# Patient Record
Sex: Female | Born: 1985 | Race: White | Hispanic: No | Marital: Married | Smoking: Never smoker
Health system: Southern US, Community
[De-identification: ages and names within clinical notes are randomized; demographics above are authoritative.]

## PROBLEM LIST (undated history)

## (undated) DIAGNOSIS — D649 Anemia, unspecified: Secondary | ICD-10-CM

## (undated) DIAGNOSIS — R5383 Other fatigue: Secondary | ICD-10-CM

## (undated) DIAGNOSIS — F53 Postpartum depression: Secondary | ICD-10-CM

## (undated) DIAGNOSIS — O99345 Other mental disorders complicating the puerperium: Secondary | ICD-10-CM

## (undated) DIAGNOSIS — G971 Other reaction to spinal and lumbar puncture: Secondary | ICD-10-CM

## (undated) DIAGNOSIS — R002 Palpitations: Secondary | ICD-10-CM

## (undated) HISTORY — DX: Anemia, unspecified: D64.9

## (undated) HISTORY — DX: Other mental disorders complicating the puerperium: O99.345

## (undated) HISTORY — DX: Other reaction to spinal and lumbar puncture: G97.1

## (undated) HISTORY — DX: Postpartum depression: F53.0

## (undated) HISTORY — PX: WISDOM TOOTH EXTRACTION: SHX21

## (undated) HISTORY — DX: Palpitations: R00.2

## (undated) HISTORY — DX: Other fatigue: R53.83

---

## 2013-12-21 ENCOUNTER — Encounter: Payer: Self-pay | Admitting: Advanced Practice Midwife

## 2013-12-21 ENCOUNTER — Ambulatory Visit (INDEPENDENT_AMBULATORY_CARE_PROVIDER_SITE_OTHER): Admitting: Advanced Practice Midwife

## 2013-12-21 VITALS — BP 134/84 | Ht 63.0 in | Wt 153.0 lb

## 2013-12-21 DIAGNOSIS — Z3480 Encounter for supervision of other normal pregnancy, unspecified trimester: Secondary | ICD-10-CM

## 2013-12-21 DIAGNOSIS — O34219 Maternal care for unspecified type scar from previous cesarean delivery: Secondary | ICD-10-CM | POA: Insufficient documentation

## 2013-12-21 DIAGNOSIS — O09299 Supervision of pregnancy with other poor reproductive or obstetric history, unspecified trimester: Secondary | ICD-10-CM | POA: Insufficient documentation

## 2013-12-21 DIAGNOSIS — E039 Hypothyroidism, unspecified: Secondary | ICD-10-CM

## 2013-12-21 LAB — OB RESULTS CONSOLE GC/CHLAMYDIA
Chlamydia: NEGATIVE
Gonorrhea: NEGATIVE

## 2013-12-21 NOTE — Progress Notes (Signed)
p-109  Bedside U/S showed IUP with FHT 178bpm   CRL 21.612mm and GA [id number]

## 2013-12-22 LAB — TSH: TSH: 0.802 u[IU]/mL (ref 0.350–4.500)

## 2013-12-22 LAB — OBSTETRIC PANEL
ANTIBODY SCREEN: NEGATIVE
BASOS ABS: 0 10*3/uL (ref 0.0–0.1)
Basophils Relative: 0 % (ref 0–1)
EOS PCT: 1 % (ref 0–5)
Eosinophils Absolute: 0.1 10*3/uL (ref 0.0–0.7)
HEMATOCRIT: 38.3 % (ref 36.0–46.0)
HEMOGLOBIN: 12.9 g/dL (ref 12.0–15.0)
Hepatitis B Surface Ag: NEGATIVE
LYMPHS PCT: 21 % (ref 12–46)
Lymphs Abs: 1.6 10*3/uL (ref 0.7–4.0)
MCH: 28.9 pg (ref 26.0–34.0)
MCHC: 33.7 g/dL (ref 30.0–36.0)
MCV: 85.9 fL (ref 78.0–100.0)
MONO ABS: 0.6 10*3/uL (ref 0.1–1.0)
MONOS PCT: 8 % (ref 3–12)
Neutro Abs: 5.4 10*3/uL (ref 1.7–7.7)
Neutrophils Relative %: 70 % (ref 43–77)
Platelets: 238 10*3/uL (ref 150–400)
RBC: 4.46 MIL/uL (ref 3.87–5.11)
RDW: 13.6 % (ref 11.5–15.5)
Rh Type: POSITIVE
Rubella: 1.44 Index — ABNORMAL HIGH (ref <0.90)
WBC: 7.7 10*3/uL (ref 4.0–10.5)

## 2013-12-22 LAB — PRESCRIPTION MONITORING PROFILE (19 PANEL)
AMPHETAMINE/METH: NEGATIVE ng/mL
BENZODIAZEPINE SCREEN, URINE: NEGATIVE ng/mL
BUPRENORPHINE, URINE: NEGATIVE ng/mL
Barbiturate Screen, Urine: NEGATIVE ng/mL
CANNABINOID SCRN UR: NEGATIVE ng/mL
CARISOPRODOL, URINE: NEGATIVE ng/mL
Cocaine Metabolites: NEGATIVE ng/mL
Creatinine, Urine: 143.55 mg/dL (ref >20.0)
Fentanyl, Ur: NEGATIVE ng/mL
MDMA URINE: NEGATIVE ng/mL
METHADONE SCREEN, URINE: NEGATIVE ng/mL
METHAQUALONE SCREEN (URINE): NEGATIVE ng/mL
Meperidine, Ur: NEGATIVE ng/mL
NITRITES URINE, INITIAL: NEGATIVE ug/mL
Opiate Screen, Urine: NEGATIVE ng/mL
Oxycodone Screen, Ur: NEGATIVE ng/mL
Phencyclidine, Ur: NEGATIVE ng/mL
Propoxyphene: NEGATIVE ng/mL
TAPENTADOLUR: NEGATIVE ng/mL
Tramadol Scrn, Ur: NEGATIVE ng/mL
Zolpidem, Urine: NEGATIVE ng/mL
pH, Initial: 7.5 pH (ref 4.5–8.9)

## 2013-12-22 LAB — PROTEIN / CREATININE RATIO, URINE
Creatinine, Urine: 141.8 mg/dL
Protein Creatinine Ratio: 0.04 (ref <0.15)
Total Protein, Urine: 6 mg/dL

## 2013-12-22 LAB — COMPREHENSIVE METABOLIC PANEL
ALBUMIN: 4.1 g/dL (ref 3.5–5.2)
ALK PHOS: 57 U/L (ref 39–117)
ALT: 19 U/L (ref 0–35)
AST: 15 U/L (ref 0–37)
BUN: 7 mg/dL (ref 6–23)
CHLORIDE: 103 meq/L (ref 96–112)
CO2: 26 mEq/L (ref 19–32)
Calcium: 9.2 mg/dL (ref 8.4–10.5)
Creat: 0.45 mg/dL — ABNORMAL LOW (ref 0.50–1.10)
Glucose, Bld: 73 mg/dL (ref 70–99)
POTASSIUM: 4.2 meq/L (ref 3.5–5.3)
SODIUM: 137 meq/L (ref 135–145)
Total Bilirubin: 0.3 mg/dL (ref 0.3–1.2)
Total Protein: 6.3 g/dL (ref 6.0–8.3)

## 2013-12-22 LAB — GC/CHLAMYDIA PROBE AMP
CT Probe RNA: NEGATIVE
GC PROBE AMP APTIMA: NEGATIVE

## 2013-12-22 NOTE — Patient Instructions (Signed)
Pregnancy - First Trimester  During sexual intercourse, millions of sperm go into the vagina. Only 1 sperm will penetrate and fertilize the female egg while it is in the Fallopian tube. One week later, the fertilized egg implants into the wall of the uterus. An embryo begins to develop into a baby. At 6 to 8 weeks, the eyes and face are formed and the heartbeat can be seen on ultrasound. At the end of 12 weeks (first trimester), all the baby's organs are formed. Now that you are pregnant, you will want to do everything you can to have a healthy baby. Two of the most important things are to get good prenatal care and follow your caregiver's instructions. Prenatal care is all the medical care you receive before the baby's birth. It is given to prevent, find, and treat problems during the pregnancy and childbirth.  PRENATAL EXAMS  · During prenatal visits, your weight, blood pressure, and urine are checked. This is done to make sure you are healthy and progressing normally during the pregnancy.  · A pregnant woman should gain 25 to 35 pounds during the pregnancy. However, if you are overweight or underweight, your caregiver will advise you regarding your weight.  · Your caregiver will ask and answer questions for you.  · Blood work, cervical cultures, other necessary tests, and a Pap test are done during your prenatal exams. These tests are done to check on your health and the probable health of your baby. Tests are strongly recommended and done for HIV with your permission. This is the virus that causes AIDS. These tests are done because medicines can be given to help prevent your baby from being born with this infection should you have been infected without knowing it. Blood work is also used to find out your blood type, previous infections, and follow your blood levels (hemoglobin).  · Low hemoglobin (anemia) is common during pregnancy. Iron and vitamins are given to help prevent this. Later in the pregnancy, blood  tests for diabetes will be done along with any other tests if any problems develop.  · You may need other tests to make sure you and the baby are doing well.  CHANGES DURING THE FIRST TRIMESTER   Your body goes through many changes during pregnancy. They vary from person to person. Talk to your caregiver about changes you notice and are concerned about. Changes can include:  · Your menstrual period stops.  · The egg and sperm carry the genes that determine what you look like. Genes from you and your partner are forming a baby. The female genes determine whether the baby is a boy or a girl.  · Your body increases in girth and you may feel bloated.  · Feeling sick to your stomach (nauseous) and throwing up (vomiting). If the vomiting is uncontrollable, call your caregiver.  · Your breasts will begin to enlarge and become tender.  · Your nipples may stick out more and become darker.  · The need to urinate more. Painful urination may mean you have a bladder infection.  · Tiring easily.  · Loss of appetite.  · Cravings for certain kinds of food.  · At first, you may gain or lose a couple of pounds.  · You may have changes in your emotions from day to day (excited to be pregnant or concerned something may go wrong with the pregnancy and baby).  · You may have more vivid and strange dreams.  HOME CARE INSTRUCTIONS   ·   It is very important to avoid all smoking, alcohol and non-prescribed drugs during your pregnancy. These affect the formation and growth of the baby. Avoid chemicals while pregnant to ensure the delivery of a healthy infant.  · Start your prenatal visits by the 12th week of pregnancy. They are usually scheduled monthly at first, then more often in the last 2 months before delivery. Keep your caregiver's appointments. Follow your caregiver's instructions regarding medicine use, blood and lab tests, exercise, and diet.  · During pregnancy, you are providing food for you and your baby. Eat regular, well-balanced  meals. Choose foods such as meat, fish, milk and other low fat dairy products, vegetables, fruits, and whole-grain breads and cereals. Your caregiver will tell you of the ideal weight gain.  · You can help morning sickness by keeping soda crackers at the bedside. Eat a couple before arising in the morning. You may want to use the crackers without salt on them.  · Eating 4 to 5 small meals rather than 3 large meals a day also may help the nausea and vomiting.  · Drinking liquids between meals instead of during meals also seems to help nausea and vomiting.  · A physical sexual relationship may be continued throughout pregnancy if there are no other problems. Problems may be early (premature) leaking of amniotic fluid from the membranes, vaginal bleeding, or belly (abdominal) pain.  · Exercise regularly if there are no restrictions. Check with your caregiver or physical therapist if you are unsure of the safety of some of your exercises. Greater weight gain will occur in the last 2 trimesters of pregnancy. Exercising will help:  · Control your weight.  · Keep you in shape.  · Prepare you for labor and delivery.  · Help you lose your pregnancy weight after you deliver your baby.  · Wear a good support or jogging bra for breast tenderness during pregnancy. This may help if worn during sleep too.  · Ask when prenatal classes are available. Begin classes when they are offered.  · Do not use hot tubs, steam rooms, or saunas.  · Wear your seat belt when driving. This protects you and your baby if you are in an accident.  · Avoid raw meat, uncooked cheese, cat litter boxes, and soil used by cats throughout the pregnancy. These carry germs that can cause birth defects in the baby.  · The first trimester is a good time to visit your dentist for your dental health. Getting your teeth cleaned is okay. Use a softer toothbrush and brush gently during pregnancy.  · Ask for help if you have financial, counseling, or nutritional needs  during pregnancy. Your caregiver will be able to offer counseling for these needs as well as refer you for other special needs.  · Do not take any medicines or herbs unless told by your caregiver.  · Inform your caregiver if there is any mental or physical domestic violence.  · Make a list of emergency phone numbers of family, friends, hospital, and police and fire departments.  · Write down your questions. Take them to your prenatal visit.  · Do not douche.  · Do not cross your legs.  · If you have to stand for long periods of time, rotate you feet or take small steps in a circle.  · You may have more vaginal secretions that may require a sanitary pad. Do not use tampons or scented sanitary pads.  MEDICINES AND DRUG USE IN PREGNANCY  ·   Take prenatal vitamins as directed. The vitamin should contain 1 milligram of folic acid. Keep all vitamins out of reach of children. Only a couple vitamins or tablets containing iron may be fatal to a baby or young child when ingested.  · Avoid use of all medicines, including herbs, over-the-counter medicines, not prescribed or suggested by your caregiver. Only take over-the-counter or prescription medicines for pain, discomfort, or fever as directed by your caregiver. Do not use aspirin, ibuprofen, or naproxen unless directed by your caregiver.  · Let your caregiver also know about herbs you may be using.  · Alcohol is related to a number of birth defects. This includes fetal alcohol syndrome. All alcohol, in any form, should be avoided completely. Smoking will cause low birth rate and premature babies.  · Street or illegal drugs are very harmful to the baby. They are absolutely forbidden. A baby born to an addicted mother will be addicted at birth. The baby will go through the same withdrawal an adult does.  · Let your caregiver know about any medicines that you have to take and for what reason you take them.  SEEK MEDICAL CARE IF:   You have any concerns or worries during your  pregnancy. It is better to call with your questions if you feel they cannot wait, rather than worry about them.  SEEK IMMEDIATE MEDICAL CARE IF:   · An unexplained oral temperature above 102° F (38.9° C) develops, or as your caregiver suggests.  · You have leaking of fluid from the vagina (birth canal). If leaking membranes are suspected, take your temperature and inform your caregiver of this when you call.  · There is vaginal spotting or bleeding. Notify your caregiver of the amount and how many pads are used.  · You develop a bad smelling vaginal discharge with a change in the color.  · You continue to feel sick to your stomach (nauseated) and have no relief from remedies suggested. You vomit blood or coffee ground-like materials.  · You lose more than 2 pounds of weight in 1 week.  · You gain more than 2 pounds of weight in 1 week and you notice swelling of your face, hands, feet, or legs.  · You gain 5 pounds or more in 1 week (even if you do not have swelling of your hands, face, legs, or feet).  · You get exposed to German measles and have never had them.  · You are exposed to fifth disease or chickenpox.  · You develop belly (abdominal) pain. Round ligament discomfort is a common non-cancerous (benign) cause of abdominal pain in pregnancy. Your caregiver still must evaluate this.  · You develop headache, fever, diarrhea, pain with urination, or shortness of breath.  · You fall or are in a car accident or have any kind of trauma.  · There is mental or physical violence in your home.  Document Released: 11/13/2001 Document Revised: 08/13/2012 Document Reviewed: 05/17/2009  ExitCare® Patient Information ©2014 [employer].

## 2013-12-22 NOTE — Progress Notes (Signed)
New OB. See other note  Subjective:    Haley Gibson is a G3P1011 [redacted]w[redacted]d being seen today for her first obstetrical visit.  Her obstetrical history is significant for History of C/S for macrosomia/failure to progress. Patient does intend to breast feed. Pregnancy history fully reviewed.  Patient reports no complaints.  Filed Vitals:   12/21/13 0903 12/21/13 0908  BP: 134/84   Height:  5\' 3"  (1.6 m)  Weight: 69.4 kg (153 lb)     HISTORY: OB History  Gravida Para Term Preterm AB SAB TAB Ectopic Multiple Living  3 1 1  1 1    1     # Outcome Date GA Lbr Len/2nd Weight Sex Delivery Anes PTL Lv  3 CUR           2 TRM 08/09/09 [redacted]w[redacted]d  4.451 kg (9 lb 13 oz) M LTCS Spinal N Y     Comments: failure to progress  1 SAB              Past Medical History  Diagnosis Date  . Spinal headache   . Fatigue     chronic  . Anemia     post delivery  . Post partum depression   . Tachycardia   . Heart palpitations    Past Surgical History  Procedure Laterality Date  . Cesarean section    . Wisdom tooth extraction     Family History  Problem Relation Age of Onset  . Diabetes Maternal Grandfather   . Hypertension Mother   . Thyroid disease Mother   . Fibromyalgia Mother   . Cancer Paternal Grandmother     breast  . Cancer Maternal Grandmother     hodgkin  . Pancreatitis Father      Exam    Uterus:     Pelvic Exam:    Perineum: No Hemorrhoids   Vulva: Bartholin's, Urethra, Skene's normal   Vagina:  normal mucosa, normal discharge   pH:    Cervix: no cervical motion tenderness   Adnexa: normal adnexa and no mass, fullness, tenderness   Bony Pelvis: gynecoid  System: Breast:  normal appearance, no masses or tenderness   Skin: normal coloration and turgor, no rashes    Neurologic: oriented, grossly non-focal   Extremities: normal strength, tone, and muscle mass   HEENT neck supple with midline trachea   Mouth/Teeth mucous membranes moist, pharynx normal without lesions   Neck supple and no masses   Cardiovascular: regular rate and rhythm   Respiratory:  appears well, vitals normal, no respiratory distress, acyanotic, normal RR, ear and throat exam is normal, neck free of mass or lymphadenopathy, chest clear, no wheezing, crepitations, rhonchi, normal symmetric air entry   Abdomen: soft, non-tender; bowel sounds normal; no masses,  no organomegaly   Urinary: urethral meatus normal      Assessment:    Pregnancy: [id number] Patient Active Problem List   Diagnosis Date Noted  . History of cesarean delivery, currently pregnant 12/21/2013  . Hypothyroidism 12/21/2013  . History of macrosomia in infant in prior pregnancy, currently pregnant 12/21/2013    SIUP at [redacted]w[redacted]d     Plan:     Initial labs drawn. Prenatal vitamins. Problem list reviewed and updated. Genetic Screening discussed First Screen and Integrated Screen: ordered.  Ultrasound discussed; fetal survey: ordered.  Follow up in 4 weeks. 50% of 30 min visit spent on counseling and coordination of care.   Will do early glucola Patient and husband had a lot of questions  regarding diet, exercise, sleeping positions, delivery, etc.  Want TOLAC.   Want CNM care. Very concerned the MDs will interfere with TOLAC.  States "the doctors interfered with the midwives in our last delivery and that is why we had a C/S"    I explained how our collaborative practice in our group works. We will do everything reasonably possible to facilitate VBAC.  We do collaborate with MDs and we do have a good C/S rate and TOLAC rate.  [facility]ILLIAMS,Saydi Kobel 12/22/2013

## 2013-12-23 LAB — CULTURE, URINE COMPREHENSIVE
COLONY COUNT: NO GROWTH
Organism ID, Bacteria: NO GROWTH

## 2014-01-18 ENCOUNTER — Encounter: Payer: Self-pay | Admitting: *Deleted

## 2014-01-18 ENCOUNTER — Encounter: Payer: Self-pay | Admitting: Advanced Practice Midwife

## 2014-01-18 ENCOUNTER — Ambulatory Visit (INDEPENDENT_AMBULATORY_CARE_PROVIDER_SITE_OTHER): Admitting: Advanced Practice Midwife

## 2014-01-18 ENCOUNTER — Encounter (HOSPITAL_COMMUNITY): Payer: Self-pay | Admitting: Advanced Practice Midwife

## 2014-01-18 VITALS — BP 117/78 | Wt 152.0 lb

## 2014-01-18 DIAGNOSIS — Z8249 Family history of ischemic heart disease and other diseases of the circulatory system: Secondary | ICD-10-CM | POA: Insufficient documentation

## 2014-01-18 DIAGNOSIS — O34219 Maternal care for unspecified type scar from previous cesarean delivery: Secondary | ICD-10-CM

## 2014-01-18 DIAGNOSIS — Z3480 Encounter for supervision of other normal pregnancy, unspecified trimester: Secondary | ICD-10-CM

## 2014-01-18 DIAGNOSIS — O3421 Maternal care for scar from previous cesarean delivery: Secondary | ICD-10-CM

## 2014-01-18 DIAGNOSIS — Z3491 Encounter for supervision of normal pregnancy, unspecified, first trimester: Secondary | ICD-10-CM

## 2014-01-18 DIAGNOSIS — O09299 Supervision of pregnancy with other poor reproductive or obstetric history, unspecified trimester: Secondary | ICD-10-CM

## 2014-01-18 DIAGNOSIS — O09291 Supervision of pregnancy with other poor reproductive or obstetric history, first trimester: Secondary | ICD-10-CM

## 2014-01-18 NOTE — Progress Notes (Signed)
Declines first trimester screen. Will plan fetal echo for FH AV stenosis. VBAC consent.

## 2014-01-18 NOTE — Progress Notes (Signed)
p=101 

## 2014-01-18 NOTE — Patient Instructions (Addendum)
[facility]  Vaginal Birth After Cesarean Delivery Vaginal birth after cesarean delivery (VBAC) is giving birth vaginally after previously delivering a baby by a cesarean. In the past, if a woman had a cesarean delivery, all births afterwards would be done by cesarean delivery. This is no longer true. It can be safe for the mother to try a vaginal delivery after having a cesarean delivery.  It is important to discuss VBAC with your health care provider early in the pregnancy so you can understand the risks, benefits, and options. It will give you time to decide what is best in your particular case. The final decision about whether to have a VBAC or repeat cesarean delivery should be between you and your health care provider. Any changes in your health or your baby's health during your pregnancy may make it necessary to change your initial decision about VBAC.  WOMEN WHO PLAN TO HAVE A VBAC SHOULD CHECK WITH THEIR HEALTH CARE PROVIDER TO BE SURE THAT:  The previous cesarean delivery was done with a low transverse uterine cut (incision) (not a vertical classical incision).   The birth canal is big enough for the baby.   There were no other operations on the uterus.   An electronic fetal monitor (EFM) will be on at all times during labor.   An operating room will be available and ready in case an emergency cesarean delivery is needed.   A health care provider and surgical nursing staff will be available at all times during labor to be ready to do an emergency delivery cesarean if necessary.   An anesthesiologist will be present in case an emergency cesarean delivery is needed.   The nursery is prepared and has adequate personnel and necessary equipment available to care for the baby in case of an emergency cesarean delivery. BENEFITS OF VBAC  Shorter stay in the hospital.   Avoidance of risks associated with cesarean delivery, such as:  Surgical complications, such as opening of the  incision or hernia in the incision.  Injury to other organs.  Fever. This can occur if an infection develops after surgery. It can also occur as a reaction to the medicine given to make you numb during the surgery.  Less blood loss and need for blood transfusions.  Lower risk of blood clots and infection.  Shorter recovery.   Decreased risk for having to remove the uterus (hysterectomy).   Decreased risk for the placenta to completely or partially cover the opening of the uterus (placenta previa) with a future pregnancy.   Decrease risk in future labor and delivery. RISKS OF A VBAC  Tearing (rupture) of the uterus. This is occurs in less than 1% of VBACs. The risk of this happening is higher if:  Steps are taken to begin the labor process (induce labor) or stimulate or strengthen contractions (augment labor).   Medicine is used to soften (ripen) the cervix.  Having to remove the uterus (hysterectomy) if it ruptures. VBAC SHOULD NOT BE DONE IF:  The previous cesarean delivery was done with a vertical (classical) or T-shaped incision or you do not know what kind of incision was made.   You had a ruptured uterus.   You have had certain types of surgery on your uterus, such as removal of uterine fibroids. Ask your health care provider about other types of surgeries that prevent you from having a VBAC.  You have certain medical or childbirth (obstetrical) problems.   There are problems with the baby.  You have had two previous cesarean deliveries and no vaginal deliveries. OTHER FACTS TO KNOW ABOUT VBAC:  It is safe to have an epidural anesthetic with VBAC.   It is safe to turn the baby from a breech position (attempt an external cephalic version).   It is safe to try a VBAC with twins.   VBAC may not be successful if your baby weights 8.8 lb (4 kg) or more. However, weight predictions are not always accurate and should not be used alone to decide if VBAC is  right for you.  There is an increased failure rate if the time between the cesarean delivery and VBAC is less than 19 months.   Your health care provider may advise against a VBAC if you have preeclampsia (high blood pressure, protein in the urine, and swelling of face and extremities).   VBAC is often successful if you previously gave birth vaginally.   VBAC is often successful when the labor starts spontaneously before the due date.   Delivering a baby through a VBAC is similar to having a normal spontaneous vaginal delivery. Document Released: 05/12/2007 Document Revised: 09/09/2013 Document Reviewed: 06/18/2013 [facility] Patient Information 2014 [place], [place].

## 2014-02-15 ENCOUNTER — Encounter

## 2014-02-19 ENCOUNTER — Ambulatory Visit (INDEPENDENT_AMBULATORY_CARE_PROVIDER_SITE_OTHER): Admitting: Family

## 2014-02-19 ENCOUNTER — Encounter: Payer: Self-pay | Admitting: Family

## 2014-02-19 VITALS — BP 124/77 | Wt 158.0 lb

## 2014-02-19 DIAGNOSIS — Z3491 Encounter for supervision of normal pregnancy, unspecified, first trimester: Secondary | ICD-10-CM

## 2014-02-19 DIAGNOSIS — Z3480 Encounter for supervision of other normal pregnancy, unspecified trimester: Secondary | ICD-10-CM

## 2014-02-19 DIAGNOSIS — R000 Tachycardia, unspecified: Secondary | ICD-10-CM

## 2014-02-19 NOTE — Progress Notes (Signed)
Pt concerns regarding feeling heartbeat easily at night.  Hx of irregular heartbeat, had cardiac eval > nml.  Denies chest pain or palpitations.  Refer to cardiology for assessment.  Ultrasound scheduled for 03/03/14.  Refer for fetal echo 22-24 wks.

## 2014-02-19 NOTE — Progress Notes (Signed)
p-92      Increased amount of round ligament pain

## 2014-03-03 ENCOUNTER — Ambulatory Visit (HOSPITAL_COMMUNITY)
Admission: RE | Admit: 2014-03-03 | Discharge: 2014-03-03 | Disposition: A | Discharge status: Home / Self Care | Source: Ambulatory Visit | Attending: Advanced Practice Midwife | Admitting: Advanced Practice Midwife

## 2014-03-03 ENCOUNTER — Other Ambulatory Visit (INDEPENDENT_AMBULATORY_CARE_PROVIDER_SITE_OTHER): Admitting: *Deleted

## 2014-03-03 ENCOUNTER — Encounter: Payer: Self-pay | Admitting: Advanced Practice Midwife

## 2014-03-03 DIAGNOSIS — Z1389 Encounter for screening for other disorder: Secondary | ICD-10-CM | POA: Insufficient documentation

## 2014-03-03 DIAGNOSIS — O352XX0 Maternal care for (suspected) hereditary disease in fetus, not applicable or unspecified: Secondary | ICD-10-CM | POA: Insufficient documentation

## 2014-03-03 DIAGNOSIS — Z3480 Encounter for supervision of other normal pregnancy, unspecified trimester: Secondary | ICD-10-CM

## 2014-03-03 DIAGNOSIS — O337XX0 Maternal care for disproportion due to other fetal deformities, not applicable or unspecified: Secondary | ICD-10-CM | POA: Insufficient documentation

## 2014-03-03 DIAGNOSIS — Z8249 Family history of ischemic heart disease and other diseases of the circulatory system: Secondary | ICD-10-CM

## 2014-03-03 DIAGNOSIS — O358XX0 Maternal care for other (suspected) fetal abnormality and damage, not applicable or unspecified: Secondary | ICD-10-CM | POA: Insufficient documentation

## 2014-03-03 DIAGNOSIS — O34219 Maternal care for unspecified type scar from previous cesarean delivery: Secondary | ICD-10-CM | POA: Insufficient documentation

## 2014-03-03 DIAGNOSIS — Z3491 Encounter for supervision of normal pregnancy, unspecified, first trimester: Secondary | ICD-10-CM

## 2014-03-03 DIAGNOSIS — Z363 Encounter for antenatal screening for malformations: Secondary | ICD-10-CM | POA: Insufficient documentation

## 2014-03-03 DIAGNOSIS — O283 Abnormal ultrasonic finding on antenatal screening of mother: Secondary | ICD-10-CM | POA: Insufficient documentation

## 2014-03-04 ENCOUNTER — Encounter: Payer: Self-pay | Admitting: Obstetrics & Gynecology

## 2014-03-04 ENCOUNTER — Telehealth: Payer: Self-pay | Admitting: *Deleted

## 2014-03-04 LAB — AFP, QUAD SCREEN
AFP: 31.5 IU/mL
Age Alone: 1:861 {titer}
Curr Gest Age: 19 wks.days
Down Syndrome Scr Risk Est: 1:38500 {titer}
HCG, Total: 4542 m[IU]/mL
INH: 80.7 pg/mL
Interpretation-AFP: NEGATIVE
MOM FOR AFP: 0.71
MOM FOR HCG: 0.27
MoM for INH: 0.44
Open Spina bifida: NEGATIVE
Osb Risk: <1:27300 {titer}
TRI 18 SCR RISK EST: NEGATIVE
UE3 MOM: 0.81
UE3 VALUE: 0.8 ng/mL

## 2014-03-04 NOTE — Telephone Encounter (Signed)
Pt notified of neg Quad screen.

## 2014-03-06 ENCOUNTER — Encounter: Payer: Self-pay | Admitting: Advanced Practice Midwife

## 2014-03-11 ENCOUNTER — Other Ambulatory Visit (HOSPITAL_COMMUNITY): Payer: Self-pay | Admitting: Obstetrics and Gynecology

## 2014-03-11 DIAGNOSIS — O337XX0 Maternal care for disproportion due to other fetal deformities, not applicable or unspecified: Secondary | ICD-10-CM

## 2014-03-11 DIAGNOSIS — O352XX0 Maternal care for (suspected) hereditary disease in fetus, not applicable or unspecified: Secondary | ICD-10-CM

## 2014-03-11 DIAGNOSIS — O34219 Maternal care for unspecified type scar from previous cesarean delivery: Secondary | ICD-10-CM

## 2014-03-19 ENCOUNTER — Ambulatory Visit (INDEPENDENT_AMBULATORY_CARE_PROVIDER_SITE_OTHER): Admitting: Advanced Practice Midwife

## 2014-03-19 VITALS — BP 113/74 | Wt 157.0 lb

## 2014-03-19 DIAGNOSIS — O09299 Supervision of pregnancy with other poor reproductive or obstetric history, unspecified trimester: Secondary | ICD-10-CM

## 2014-03-19 NOTE — Progress Notes (Signed)
Doing well.  Good fetal movement, denies vaginal bleeding, LOF, regular contractions.  Worried about weight gain this week, gained 80lbs in last pregnancy. This pregnancy she is exercising, walking daily, doing yoga, and eating healthy.  Reassurance provided that weight gain has been appropriate and her healthy habits will help keep weight gain in check.  Early glucola today r/t hx of macrosomia.  Desires VBAC, upset about no waterbirth option for VBAC. Discussed other options for labor support and positioning while at hospital, including birth ball, shower, moving around in room, etc.

## 2014-03-23 LAB — GLUCOSE TOLERANCE, 1 HOUR (50G) W/O FASTING: Glucose, 1 Hour GTT: 119 mg/dL (ref 70–140)

## 2014-03-27 ENCOUNTER — Encounter: Payer: Self-pay | Admitting: Advanced Practice Midwife

## 2014-03-31 ENCOUNTER — Ambulatory Visit (INDEPENDENT_AMBULATORY_CARE_PROVIDER_SITE_OTHER): Admitting: Cardiology

## 2014-03-31 ENCOUNTER — Encounter: Payer: Self-pay | Admitting: Cardiology

## 2014-03-31 ENCOUNTER — Encounter: Payer: Self-pay | Admitting: *Deleted

## 2014-03-31 VITALS — BP 102/72 | HR 92 | Ht 63.0 in | Wt 170.0 lb

## 2014-03-31 DIAGNOSIS — I472 Ventricular tachycardia, unspecified: Secondary | ICD-10-CM | POA: Insufficient documentation

## 2014-03-31 DIAGNOSIS — R0989 Other specified symptoms and signs involving the circulatory and respiratory systems: Secondary | ICD-10-CM

## 2014-03-31 DIAGNOSIS — R002 Palpitations: Secondary | ICD-10-CM

## 2014-03-31 DIAGNOSIS — R0600 Dyspnea, unspecified: Secondary | ICD-10-CM | POA: Insufficient documentation

## 2014-03-31 DIAGNOSIS — R0609 Other forms of dyspnea: Secondary | ICD-10-CM

## 2014-03-31 DIAGNOSIS — I4729 Other ventricular tachycardia: Secondary | ICD-10-CM

## 2014-03-31 NOTE — Assessment & Plan Note (Signed)
Patient states she had ventricular tachycardia diagnosed in 2012 in [place]. She was transiently on verapamil. I will obtain all records. She is having palpitations now but these are not like previous palpitations with ventricular tachycardia. I will place a monitor. We may need to resume verapamil pending results of her monitor.

## 2014-03-31 NOTE — Assessment & Plan Note (Signed)
Check echocardiogram 

## 2014-03-31 NOTE — Progress Notes (Signed)
HPI: 28 year old female for evaluation of palpitations. Patient states that she has had palpitations for approximately 10 years. In 2012 she was having palpitations and was seen in [place]. She had an echocardiogram that apparently was normal but records are not available. She wore a monitor and apparently showed ventricular tachycardia and was placed on verapamil. She continued this for approximately 2 months but didn't think it was helping and discontinued this. She has not been seen by cardiologist since. She is now [redacted] weeks pregnant. She notes some dyspnea on exertion as well as orthopnea. No PND or pedal edema. No chest pain or syncope. She occasionally feels her heart "pounds" but when she checks her heart rate it is in the 90s. She has not had symptoms similar to those with palpitations and 2012 with ventricular tachycardia. She last had those approximately one year ago.  Current Outpatient Prescriptions  Medication Sig Dispense Refill  . Cholecalciferol (VITAMIN D PO) Take 5,000 Units by mouth daily.      . Ferrous Sulfate (IRON SUPPLEMENT PO) Take 1 tablet by mouth daily. Flora Iron Supplement      . MAGNESIUM CITRATE PO Take 150 mg by mouth 2 (two) times daily.      . Omega-3 Fatty Acids (OMEGA 3 PO) Take 1 capsule by mouth daily.      . Prenatal Multivit-Min-Fe-FA (PRENATAL VITAMINS PO) Take by mouth daily.      . Probiotic Product (PROBIOTIC DAILY PO) Take by mouth daily.       No current facility-administered medications for this visit.    Allergies  Allergen Reactions  . Codeine Diarrhea    Past Medical History  Diagnosis Date  . Spinal headache   . Fatigue     chronic  . Anemia     post delivery  . Post partum depression   . Heart palpitations     Past Surgical History  Procedure Laterality Date  . Cesarean section    . Wisdom tooth extraction      History   Social History  . Marital Status: Married    Spouse Name: N/A    Number of Children: 1  .  Years of Education: N/A   Occupational History  . homemaker    Social History Main Topics  . Smoking status: Never Smoker   . Smokeless tobacco: Never Used  . Alcohol Use: No  . Drug Use: No  . Sexual Activity: Yes    Partners: Male   Other Topics Concern  . Not on file   Social History Narrative  . No narrative on file    Family History  Problem Relation Age of Onset  . Diabetes Maternal Grandfather   . Hypertension Mother   . Thyroid disease Mother   . Fibromyalgia Mother   . Cancer Paternal Grandmother     breast  . Cancer Maternal Grandmother     hodgkin  . Pancreatitis Father   . Heart disease Brother     AS    ROS: anxiety but no fevers or chills, productive cough, hemoptysis, dysphasia, odynophagia, melena, hematochezia, dysuria, hematuria, rash, seizure activity, PND, claudication. Remaining systems are negative.  Physical Exam:   Blood pressure 102/72, pulse 92, height 5\' 3"  (1.6 m), weight 170 lb (77.111 kg), last menstrual period 10/15/2013.  General:  Well developed/well nourished in NAD Skin warm/dry Patient not depressed No peripheral clubbing Back-normal HEENT-normal/normal eyelids Neck supple/normal carotid upstroke bilaterally; no bruits; no JVD; no thyromegaly chest - CTA/  normal expansion CV - RRR/normal S1 and S2; no murmurs, rubs or gallops;  PMI nondisplaced Abdomen -NT/ND, no HSM, Intrauterine pregnancy, + bowel sounds, no bruit 2+ femoral pulses, no bruits Ext-no edema, chords, 2+ DP Neuro-grossly nonfocal  ECG Sinus rhythm at a rate of 92. No ST changes.

## 2014-03-31 NOTE — Patient Instructions (Signed)
Your physician recommends that you schedule a follow-up appointment in: 4-6 WEEKS WITH DR CRENSHAW IN [place]  Your physician has requested that you have an echocardiogram. Echocardiography is a painless test that uses sound waves to create images of your heart. It provides your doctor with information about the size and shape of your heart and how well your heart's chambers and valves are working. This procedure takes approximately one hour. There are no restrictions for this procedure.[facility]  Your physician has recommended that you wear an event monitor. Event monitors are medical devices that record the heart's electrical activity. Doctors most often us these monitors to diagnose arrhythmias. Arrhythmias are problems with the speed or rhythm of the heartbeat. The monitor is a small, portable device. You can wear one while you do your normal daily activities. This is usually used to diagnose what is causing palpitations/syncope (passing out).

## 2014-04-03 ENCOUNTER — Encounter: Payer: Self-pay | Admitting: Family

## 2014-04-14 ENCOUNTER — Ambulatory Visit (HOSPITAL_COMMUNITY)
Admission: RE | Admit: 2014-04-14 | Discharge: 2014-04-14 | Disposition: A | Discharge status: Home / Self Care | Source: Ambulatory Visit | Attending: Obstetrics and Gynecology | Admitting: Obstetrics and Gynecology

## 2014-04-14 ENCOUNTER — Encounter (HOSPITAL_COMMUNITY): Payer: Self-pay

## 2014-04-14 VITALS — BP 123/78 | HR 104 | Wt 171.0 lb

## 2014-04-14 DIAGNOSIS — O09291 Supervision of pregnancy with other poor reproductive or obstetric history, first trimester: Secondary | ICD-10-CM

## 2014-04-14 DIAGNOSIS — O337XX0 Maternal care for disproportion due to other fetal deformities, not applicable or unspecified: Secondary | ICD-10-CM | POA: Insufficient documentation

## 2014-04-14 DIAGNOSIS — O283 Abnormal ultrasonic finding on antenatal screening of mother: Secondary | ICD-10-CM

## 2014-04-14 DIAGNOSIS — O34219 Maternal care for unspecified type scar from previous cesarean delivery: Secondary | ICD-10-CM | POA: Insufficient documentation

## 2014-04-14 DIAGNOSIS — Z3689 Encounter for other specified antenatal screening: Secondary | ICD-10-CM | POA: Insufficient documentation

## 2014-04-14 DIAGNOSIS — Z3491 Encounter for supervision of normal pregnancy, unspecified, first trimester: Secondary | ICD-10-CM

## 2014-04-14 DIAGNOSIS — O352XX0 Maternal care for (suspected) hereditary disease in fetus, not applicable or unspecified: Secondary | ICD-10-CM | POA: Insufficient documentation

## 2014-04-14 DIAGNOSIS — Z8249 Family history of ischemic heart disease and other diseases of the circulatory system: Secondary | ICD-10-CM

## 2014-04-15 ENCOUNTER — Ambulatory Visit (HOSPITAL_COMMUNITY): Attending: Cardiovascular Disease | Admitting: Radiology

## 2014-04-15 ENCOUNTER — Encounter: Payer: Self-pay | Admitting: *Deleted

## 2014-04-15 ENCOUNTER — Encounter (INDEPENDENT_AMBULATORY_CARE_PROVIDER_SITE_OTHER)

## 2014-04-15 DIAGNOSIS — R002 Palpitations: Secondary | ICD-10-CM

## 2014-04-15 NOTE — Progress Notes (Signed)
Patient ID: Haley LeberAprille J Burgin, female   DOB: 1985/12/13, 28 y.o.   MRN: [id number] E-Cardio verite 30 day cardiac event monitor applied to patient.

## 2014-04-15 NOTE — Progress Notes (Signed)
Echocardiogram performed.  

## 2014-04-16 ENCOUNTER — Ambulatory Visit (INDEPENDENT_AMBULATORY_CARE_PROVIDER_SITE_OTHER): Admitting: Advanced Practice Midwife

## 2014-04-16 VITALS — BP 125/87 | HR 109 | Wt 177.0 lb

## 2014-04-16 DIAGNOSIS — O09299 Supervision of pregnancy with other poor reproductive or obstetric history, unspecified trimester: Secondary | ICD-10-CM

## 2014-04-16 NOTE — Progress Notes (Signed)
Doing well.  Good fetal movement, denies vaginal bleeding, LOF, regular contractions.  Pt concerned about weight gain of 25-27 lbs this pregnancy.  She is eating healthy, drinking 80 oz water, and exercising daily.  She is also taking measures to improve fetal position prior to labor including Spinning Babies positioning, chiropractic, and walking/yoga.  Discussed labor expectations today, including recommendations for induction if GHTN develops at term, no use of prostaglandins with hx of VBAC, and requirements for saline lock and continues EFM.  Pt states understanding and is flexible about birth plan but desires low-intervention vaginal birth.  Size  >dates today but U/S on 5/13 indicates 65%tile.

## 2014-04-16 NOTE — Progress Notes (Signed)
Pt's weight increased but reports her weight at home was 163lb at last visit  Drinking 80 oz of water daily

## 2014-04-21 ENCOUNTER — Institutional Professional Consult (permissible substitution): Admitting: Cardiology

## 2014-04-30 ENCOUNTER — Encounter: Admitting: Obstetrics & Gynecology

## 2014-05-03 ENCOUNTER — Telehealth: Payer: Self-pay | Admitting: *Deleted

## 2014-05-03 ENCOUNTER — Ambulatory Visit (INDEPENDENT_AMBULATORY_CARE_PROVIDER_SITE_OTHER): Admitting: Advanced Practice Midwife

## 2014-05-03 VITALS — BP 109/72 | HR 110 | Wt 184.0 lb

## 2014-05-03 DIAGNOSIS — Z3480 Encounter for supervision of other normal pregnancy, unspecified trimester: Secondary | ICD-10-CM

## 2014-05-03 DIAGNOSIS — Z3491 Encounter for supervision of normal pregnancy, unspecified, first trimester: Secondary | ICD-10-CM

## 2014-05-03 LAB — CBC
HCT: 31.7 % — ABNORMAL LOW (ref 36.0–46.0)
Hemoglobin: 11.1 g/dL — ABNORMAL LOW (ref 12.0–15.0)
MCH: 29.8 pg (ref 26.0–34.0)
MCHC: 35 g/dL (ref 30.0–36.0)
MCV: 85.2 fL (ref 78.0–100.0)
PLATELETS: 163 10*3/uL (ref 150–400)
RBC: 3.72 MIL/uL — ABNORMAL LOW (ref 3.87–5.11)
RDW: 14.4 % (ref 11.5–15.5)
WBC: 10.8 10*3/uL — ABNORMAL HIGH (ref 4.0–10.5)

## 2014-05-03 NOTE — Patient Instructions (Signed)
Vaginal Birth After Cesarean Delivery Vaginal birth after cesarean delivery (VBAC) is giving birth vaginally after previously delivering a baby by a cesarean. In the past, if a woman had a cesarean delivery, all births afterwards would be done by cesarean delivery. This is no longer true. It can be safe for the mother to try a vaginal delivery after having a cesarean delivery.  It is important to discuss VBAC with your health care provider early in the pregnancy so you can understand the risks, benefits, and options. It will give you time to decide what is best in your particular case. The final decision about whether to have a VBAC or repeat cesarean delivery should be between you and your health care provider. Any changes in your health or your baby's health during your pregnancy may make it necessary to change your initial decision about VBAC.  WOMEN WHO PLAN TO HAVE A VBAC SHOULD CHECK WITH THEIR HEALTH CARE PROVIDER TO BE SURE THAT:  The previous cesarean delivery was done with a low transverse uterine cut (incision) (not a vertical classical incision).   The birth canal is big enough for the baby.   There were no other operations on the uterus.   An electronic fetal monitor (EFM) will be on at all times during labor.   An operating room will be available and ready in case an emergency cesarean delivery is needed.   A health care provider and surgical nursing staff will be available at all times during labor to be ready to do an emergency delivery cesarean if necessary.   An anesthesiologist will be present in case an emergency cesarean delivery is needed.   The nursery is prepared and has adequate personnel and necessary equipment available to care for the baby in case of an emergency cesarean delivery. BENEFITS OF VBAC  Shorter stay in the hospital.   Avoidance of risks associated with cesarean delivery, such as:  Surgical complications, such as opening of the incision or  hernia in the incision.  Injury to other organs.  Fever. This can occur if an infection develops after surgery. It can also occur as a reaction to the medicine given to make you numb during the surgery.  Less blood loss and need for blood transfusions.  Lower risk of blood clots and infection.  Shorter recovery.   Decreased risk for having to remove the uterus (hysterectomy).   Decreased risk for the placenta to completely or partially cover the opening of the uterus (placenta previa) with a future pregnancy.   Decrease risk in future labor and delivery. RISKS OF A VBAC  Tearing (rupture) of the uterus. This is occurs in less than 1% of VBACs. The risk of this happening is higher if:  Steps are taken to begin the labor process (induce labor) or stimulate or strengthen contractions (augment labor).   Medicine is used to soften (ripen) the cervix.  Having to remove the uterus (hysterectomy) if it ruptures. VBAC SHOULD NOT BE DONE IF:  The previous cesarean delivery was done with a vertical (classical) or T-shaped incision or you do not know what kind of incision was made.   You had a ruptured uterus.   You have had certain types of surgery on your uterus, such as removal of uterine fibroids. Ask your health care provider about other types of surgeries that prevent you from having a VBAC.  You have certain medical or childbirth (obstetrical) problems.   There are problems with the baby.   You   have had two previous cesarean deliveries and no vaginal deliveries. OTHER FACTS TO KNOW ABOUT VBAC:  It is safe to have an epidural anesthetic with VBAC.   It is safe to turn the baby from a breech position (attempt an external cephalic version).   It is safe to try a VBAC with twins.   VBAC may not be successful if your baby weights 8.8 lb (4 kg) or more. However, weight predictions are not always accurate and should not be used alone to decide if VBAC is right for  you.  There is an increased failure rate if the time between the cesarean delivery and VBAC is less than 19 months.   Your health care provider may advise against a VBAC if you have preeclampsia (high blood pressure, protein in the urine, and swelling of face and extremities).   VBAC is often successful if you previously gave birth vaginally.   VBAC is often successful when the labor starts spontaneously before the due date.   Delivering a baby through a VBAC is similar to having a normal spontaneous vaginal delivery. Document Released: 05/12/2007 Document Revised: 09/09/2013 Document Reviewed: 06/18/2013 Exit[employer] Patient Information 2014 [employer].  

## 2014-05-03 NOTE — Progress Notes (Signed)
28 week labs 

## 2014-05-03 NOTE — Progress Notes (Signed)
TDaP info given. 28 week labs. Musculoskeletal pain. Comfort measures.

## 2014-05-03 NOTE — Telephone Encounter (Signed)
eCardio alert reviewed by dr Jens Som, sinus to sinus tach. Pt does not want to start on any medicine at this time, toprol 25 mg suggested by dr Jens Som She would like to discuss at her follow up appt

## 2014-05-04 ENCOUNTER — Encounter: Payer: Self-pay | Admitting: Advanced Practice Midwife

## 2014-05-04 ENCOUNTER — Telehealth: Payer: Self-pay | Admitting: *Deleted

## 2014-05-04 LAB — GLUCOSE TOLERANCE, 1 HOUR (50G) W/O FASTING: Glucose, 1 Hour GTT: 127 mg/dL (ref 70–140)

## 2014-05-04 LAB — HIV ANTIBODY (ROUTINE TESTING W REFLEX): HIV 1&2 Ab, 4th Generation: NONREACTIVE

## 2014-05-04 LAB — RPR

## 2014-05-04 NOTE — Telephone Encounter (Signed)
Called pt to adv glucose test WNL - [facility]

## 2014-05-17 ENCOUNTER — Ambulatory Visit (INDEPENDENT_AMBULATORY_CARE_PROVIDER_SITE_OTHER): Admitting: Advanced Practice Midwife

## 2014-05-17 VITALS — BP 109/76 | HR 101 | Wt 184.0 lb

## 2014-05-17 DIAGNOSIS — Z3491 Encounter for supervision of normal pregnancy, unspecified, first trimester: Secondary | ICD-10-CM

## 2014-05-17 DIAGNOSIS — Z3480 Encounter for supervision of other normal pregnancy, unspecified trimester: Secondary | ICD-10-CM

## 2014-05-17 DIAGNOSIS — Z23 Encounter for immunization: Secondary | ICD-10-CM

## 2014-05-17 MED ORDER — TETANUS-DIPHTH-ACELL PERTUSSIS 5-2.5-18.5 LF-MCG/0.5 IM SUSP: 0.5000 mL | Freq: Once | INTRAMUSCULAR | Status: DC

## 2014-05-17 NOTE — Patient Instructions (Signed)
Preterm Labor Information Preterm labor is when labor starts at less than 37 weeks of pregnancy. The normal length of a pregnancy is 39 to 41 weeks. CAUSES Often, there is no identifiable underlying cause as to why a woman goes into preterm labor. One of the most common known causes of preterm labor is infection. Infections of the uterus, cervix, vagina, amniotic sac, bladder, kidney, or even the lungs (pneumonia) can cause labor to start. Other suspected causes of preterm labor include:   Urogenital infections, such as yeast infections and bacterial vaginosis.   Uterine abnormalities (uterine shape, uterine septum, fibroids, or bleeding from the placenta).   A cervix that has been operated on (it may fail to stay closed).   Malformations in the fetus.   Multiple gestations (twins, triplets, and so on).   Breakage of the amniotic sac.  RISK FACTORS  Having a previous history of preterm labor.   Having premature rupture of membranes (PROM).   Having a placenta that covers the opening of the cervix (placenta previa).   Having a placenta that separates from the uterus (placental abruption).   Having a cervix that is too weak to hold the fetus in the uterus (incompetent cervix).   Having too much fluid in the amniotic sac (polyhydramnios).   Taking illegal drugs or smoking while pregnant.   Not gaining enough weight while pregnant.   Being younger than 1818 and older than 28 years old.   Having a low socioeconomic status.   Being African American. SYMPTOMS Signs and symptoms of preterm labor include:   Menstrual-like cramps, abdominal pain, or back pain.  Uterine contractions that are regular, as frequent as six in an hour, regardless of their intensity (may be mild or painful).  Contractions that start on the top of the uterus and spread down to the lower abdomen and back.   A sense of increased pelvic pressure.   A watery or bloody mucus discharge that  comes from the vagina.  TREATMENT Depending on the length of the pregnancy and other circumstances, your health care provider may suggest bed rest. If necessary, there are medicines that can be given to stop contractions and to mature the fetal lungs. If labor happens before 34 weeks of pregnancy, a prolonged hospital stay may be recommended. Treatment depends on the condition of both you and the fetus.  WHAT SHOULD YOU DO IF YOU THINK YOU ARE IN PRETERM LABOR? Call your health care provider right away. You will need to go to the hospital to get checked immediately. HOW CAN YOU PREVENT PRETERM LABOR IN FUTURE PREGNANCIES? You should:   Stop smoking if you smoke.  Maintain healthy weight gain and avoid chemicals and drugs that are not necessary.  Be watchful for any type of infection.  Inform your health care provider if you have a known history of preterm labor. Document Released: 02/09/2004 Document Revised: 07/22/2013 Document Reviewed: 12/22/2012 [facility] Patient Information 2014 [place], [place].  Tetanus, Diphtheria (Td) Vaccine What You Need to Know WHY GET VACCINATED? Tetanus  and diphtheria are very serious diseases. They are rare in the [place] today, but people who do become infected often have severe complications. Td vaccine is used to protect adolescents and adults from both of these diseases. Both tetanus and diphtheria are infections caused by bacteria. Diphtheria spreads from person to person through coughing or sneezing. Tetanus-causing bacteria enter the body through cuts, scratches, or wounds. TETANUS (Lockjaw) causes painful muscle tightening and stiffness, usually all over the  body.  It can lead to tightening of muscles in the head and neck so you can't open your mouth, swallow, or sometimes even breathe. Tetanus kills about 1 out of every 5 people who are infected. DIPHTHERIA can cause a thick coating to form in the back of the throat.  It can lead to  breathing problems, paralysis, heart failure, and death. Before vaccines, the [place] saw as many as 200,000 cases a year of diphtheria and hundreds of cases of tetanus. Since vaccination began, cases of both diseases have dropped by about 99%. TD VACCINE Td vaccine can protect adolescents and adults from tetanus and diphtheria. Td is usually given as a booster dose every 10 years but it can also be given earlier after a severe and dirty wound or burn. Your doctor can give you more information. Td may safely be given at the same time as other vaccines. SOME PEOPLE SHOULD NOT GET THIS VACCINE  If you ever had a life-threatening allergic reaction after a dose of any tetanus or diphtheria containing vaccine, OR if you have a severe allergy to any part of this vaccine, you should not get Td. Tell your doctor if you have any severe allergies.  Talk to your doctor if you:  have epilepsy or another nervous system problem,  had severe pain or swelling after any vaccine containing diphtheria or tetanus,  ever had Guillain Barr Syndrome (GBS),  aren't feeling well on the day the shot is scheduled. RISKS OF A VACCINE REACTION With a vaccine, like any medicine, there is a chance of side effects. These are usually mild and go away on their own. Serious side effects are also possible, but are very rare. Most people who get Td vaccine do not have any problems with it. Mild Problems  following Td (Did not interfere with activities)  Pain where the shot was given (about 8 people in 10)  Redness or swelling where the shot was given (about 1 person in 3)  Mild fever (about 1 person in 15)  Headache or Tiredness (uncommon) Moderate Problems following Td (Interfered with activities, but did not require medical attention)  Fever over 102 F (38.9 C) (rare) Severe Problems  following Td (Unable to perform usual activities; required medical attention)  Swelling, severe pain, bleeding, or  redness in the arm where the shot was given (rare). Problems that could happen after any vaccine:  Brief fainting spells can happen after any medical procedure, including vaccination. Sitting or lying down for about 15 minutes can help prevent fainting, and injuries caused by a fall. Tell your doctor if you feel dizzy, or have vision changes or ringing in the ears.  Severe shoulder pain and reduced range of motion in the arm where a shot was given can happen, very rarely, after a vaccination.  Severe allergic reactions from a vaccine are very rare, estimated at less than 1 in a million doses. If one were to occur, it would usually be within a few minutes to a few hours after the vaccination. WHAT IF THERE IS A SERIOUS REACTION? What should I look for?  Look for anything that concerns you, such as signs of a severe allergic reaction, very high fever, or behavior changes. Signs of a severe allergic reaction can include hives, swelling of the face and throat, difficulty breathing, a fast heartbeat, dizziness, and weakness. These would usually start a few minutes to a few hours after the vaccination. What should I do?  If you think it  is a severe allergic reaction or other emergency that can't wait, call 911 or get the person to the nearest hospital. Otherwise, call your doctor.  Afterward, the reaction should be reported to the Vaccine Adverse Event Reporting System (VAERS). Your doctor might file this report, or, you can do it yourself through the VAERS website or by calling 1-(843) 180-7628. VAERS is only for reporting reactions. They do not give medical advice. THE NATIONAL VACCINE INJURY COMPENSATION PROGRAM The National Vaccine Injury Compensation Program (VICP) is a federal program that was created to compensate people who may have been injured by certain vaccines. Persons who believe they may have been injured by a vaccine can learn about the program and about filing a claim by calling  1-825-105-9116 or visiting the [facility] website. HOW CAN I LEARN MORE?  Ask your doctor.  Contact your local or state health department.  Contact the Centers for Disease Control and Prevention (CDC):  Call 518-219-77471-(504)202-4367 (1-800-CDC-INFO)  Visit CDC's vaccines website CDC Td Vaccine Interim VIS (01/06/13) Document Released: 09/16/2006 Document Revised: 03/16/2013 Document Reviewed: 03/11/2013 [facility] Patient Information 2014 [place], [place].

## 2014-05-17 NOTE — Progress Notes (Signed)
TDaP today 

## 2014-05-18 ENCOUNTER — Encounter: Payer: Self-pay | Admitting: Cardiology

## 2014-05-19 ENCOUNTER — Encounter: Payer: Self-pay | Admitting: Cardiology

## 2014-05-19 ENCOUNTER — Ambulatory Visit (INDEPENDENT_AMBULATORY_CARE_PROVIDER_SITE_OTHER): Admitting: Cardiology

## 2014-05-19 VITALS — BP 100/62 | HR 112 | Ht 63.0 in | Wt 186.4 lb

## 2014-05-19 DIAGNOSIS — R002 Palpitations: Secondary | ICD-10-CM

## 2014-05-19 DIAGNOSIS — R0609 Other forms of dyspnea: Secondary | ICD-10-CM

## 2014-05-19 DIAGNOSIS — R0989 Other specified symptoms and signs involving the circulatory and respiratory systems: Secondary | ICD-10-CM

## 2014-05-19 DIAGNOSIS — R0600 Dyspnea, unspecified: Secondary | ICD-10-CM

## 2014-05-19 NOTE — Assessment & Plan Note (Signed)
Some improvement. LV function normal.

## 2014-05-19 NOTE — Assessment & Plan Note (Signed)
Patient continues to have occasional palpitations.Review of her monitor shows brief PAT. These do not appear to be significantly problematic. I will forward a beta blocker at this point given intrauterine pregnancy. We will consider this in the future if her symptoms worsen. Note she was told in [place] in 2012 that she had ventricular tachycardia. Those records are not available. There was no evidence of ventricular tachycardia on most recent monitor. LV function is normal.

## 2014-05-19 NOTE — Patient Instructions (Signed)
Your physician wants you to follow-up in: 5 MONTHS WITH DR CRENSHAW You will receive a reminder letter in the mail two months in advance. If you don't receive a letter, please call our office to schedule the follow-up appointment.  

## 2014-05-19 NOTE — Progress Notes (Signed)
      HPI: 28 year old female for followup of palpitations. Patient states that she has had palpitations for approximately 10 years. In 2012 she was having palpitations and was seen in [place]. She had an echocardiogram that apparently was normal but records are not available. She wore a monitor and apparently showed ventricular tachycardia and was placed on verapamil. She continued this for approximately 2 months but didn't think it was helping and discontinued this. I initially saw her in April of 2015. Echocardiogram in May 2013 showed normal LV function and mild to moderate tricuspid regurgitation. Monitor showed sinus rhythm with episodes of sinus tachycardia and brief PAT. Since she was last seen, There is some dyspnea on exertion but no orthopnea, PND, syncope or chest pain. She continues to have brief palpitations described as a flutter.   Current Outpatient Prescriptions  Medication Sig Dispense Refill  . Cholecalciferol (VITAMIN D PO) Take 5,000 Units by mouth daily.      . Ferrous Sulfate (IRON SUPPLEMENT PO) Take 1 tablet by mouth daily. Flora Iron Supplement      . MAGNESIUM CITRATE PO Take 150 mg by mouth 2 (two) times daily.      . Omega-3 Fatty Acids (OMEGA 3 PO) Take 1 capsule by mouth daily.      . Prenatal Multivit-Min-Fe-FA (PRENATAL VITAMINS PO) Take 1 capsule by mouth daily.       . Probiotic Product (PROBIOTIC DAILY PO) Take 1 capsule by mouth daily.        Current Facility-Administered Medications  Medication Dose Route Frequency Provider Last Rate Last Dose  . Tdap (BOOSTRIX) injection 0.5 mL  0.5 mL Intramuscular Once Dorathy KinsmanVirginia Smith, CNM         Past Medical History  Diagnosis Date  . Spinal headache   . Fatigue     chronic  . Anemia     post delivery  . Post partum depression   . Heart palpitations     Past Surgical History  Procedure Laterality Date  . Cesarean section    . Wisdom tooth extraction      History   Social History  . Marital Status:  Married    Spouse Name: N/A    Number of Children: 1  . Years of Education: N/A   Occupational History  . homemaker    Social History Main Topics  . Smoking status: Never Smoker   . Smokeless tobacco: Never Used  . Alcohol Use: No  . Drug Use: No  . Sexual Activity: Yes    Partners: Male   Other Topics Concern  . Not on file   Social History Narrative  . No narrative on file    ROS: no fevers or chills, productive cough, hemoptysis, dysphasia, odynophagia, melena, hematochezia, dysuria, hematuria, rash, seizure activity, orthopnea, PND, pedal edema, claudication. Remaining systems are negative.  Physical Exam: Well-developed well-nourished in no acute distress.  Skin is warm and dry.  HEENT is normal.  Neck is supple.  Chest is clear to auscultation with normal expansion.  Cardiovascular exam is regular rate and rhythm.  Abdominal exam nontender or distended. No masses palpated. 30 weeks intrauterine pregnancy Extremities show no edema. neuro grossly intact

## 2014-06-02 ENCOUNTER — Inpatient Hospital Stay (HOSPITAL_COMMUNITY): Admission: AD | Admit: 2014-06-02 | Source: Ambulatory Visit | Admitting: Obstetrics & Gynecology

## 2014-06-07 ENCOUNTER — Ambulatory Visit (INDEPENDENT_AMBULATORY_CARE_PROVIDER_SITE_OTHER): Admitting: Family

## 2014-06-07 ENCOUNTER — Encounter: Payer: Self-pay | Admitting: *Deleted

## 2014-06-07 VITALS — BP 113/77 | HR 104 | Wt 191.0 lb

## 2014-06-07 DIAGNOSIS — Z3480 Encounter for supervision of other normal pregnancy, unspecified trimester: Secondary | ICD-10-CM

## 2014-06-07 DIAGNOSIS — Z8249 Family history of ischemic heart disease and other diseases of the circulatory system: Secondary | ICD-10-CM

## 2014-06-07 DIAGNOSIS — R002 Palpitations: Secondary | ICD-10-CM

## 2014-06-07 DIAGNOSIS — Z3491 Encounter for supervision of normal pregnancy, unspecified, first trimester: Secondary | ICD-10-CM

## 2014-06-07 NOTE — Progress Notes (Signed)
Reports 1-2 contractions occuring x 2 days last week.  No vaginal bleeding or leaking of fluid.  Explained warning signs of labor.  Discussed in detail desires for birth - reviewed birth plan, realistic desires.  Pt main goal is a undisturbed labor, agrees to interventions when needed.  Desires intermittent monitoring > called hospital to confirm policy > continuous monitoring only required if pitocin augmentation.  Explained FP midwifery service, often unable to be at bedside, doula useful for labor support.  CNM is the patient's voice for desired birth (decreased interventions if not needed).  Verbalizes understanding.

## 2014-06-07 NOTE — Progress Notes (Signed)
Pt has some lower back pain from irregular contractions radiating around

## 2014-06-08 ENCOUNTER — Encounter: Payer: Self-pay | Admitting: Family

## 2014-06-23 ENCOUNTER — Ambulatory Visit (INDEPENDENT_AMBULATORY_CARE_PROVIDER_SITE_OTHER): Admitting: Obstetrics & Gynecology

## 2014-06-23 VITALS — BP 126/86 | HR 109 | Wt 196.0 lb

## 2014-06-23 DIAGNOSIS — Z3480 Encounter for supervision of other normal pregnancy, unspecified trimester: Secondary | ICD-10-CM

## 2014-06-23 DIAGNOSIS — Z3491 Encounter for supervision of normal pregnancy, unspecified, first trimester: Secondary | ICD-10-CM

## 2014-06-23 NOTE — Progress Notes (Signed)
Pt's fundal height is 40 cm.  Discussed getting US which patient declines.  Midwives to discuss watching for macrosomia.  Suggest fasting insulin next visit.  Will let midwives discuss this with patient.

## 2014-07-05 ENCOUNTER — Ambulatory Visit (INDEPENDENT_AMBULATORY_CARE_PROVIDER_SITE_OTHER): Admitting: Advanced Practice Midwife

## 2014-07-05 ENCOUNTER — Encounter: Payer: Self-pay | Admitting: Advanced Practice Midwife

## 2014-07-05 ENCOUNTER — Encounter

## 2014-07-05 VITALS — BP 129/84 | HR 122 | Wt 199.0 lb

## 2014-07-05 DIAGNOSIS — Z3480 Encounter for supervision of other normal pregnancy, unspecified trimester: Secondary | ICD-10-CM

## 2014-07-05 DIAGNOSIS — I472 Ventricular tachycardia, unspecified: Secondary | ICD-10-CM

## 2014-07-05 DIAGNOSIS — Z3685 Encounter for antenatal screening for Streptococcus B: Secondary | ICD-10-CM

## 2014-07-05 DIAGNOSIS — Z3483 Encounter for supervision of other normal pregnancy, third trimester: Secondary | ICD-10-CM

## 2014-07-05 DIAGNOSIS — Z113 Encounter for screening for infections with a predominantly sexual mode of transmission: Secondary | ICD-10-CM

## 2014-07-05 DIAGNOSIS — I4729 Other ventricular tachycardia: Secondary | ICD-10-CM

## 2014-07-05 LAB — OB RESULTS CONSOLE GBS: STREP GROUP B AG: NEGATIVE

## 2014-07-05 NOTE — Progress Notes (Signed)
Birth plan reviewed. Basically wants low intervention birth. Discussed safety issues with monitoring, agrees to intervention if fetal condition warrants. Wants to do positioning, etc to avoid OP. Discussed possibility of OP despite interventions, but will try. Patient wants to check position now, reviewed most babies enter inlet of pelvis in OT and whatever position baby is in now (as long as vertex) will not necessarily predict OA/OP at labor. Discussed recommendation for Fasting BS next visit, with rationale of detecting subclinical diabetes. Agrees to do it next visit. Got to 8cm with last baby and was dilated to that for 4-6 hours before C/S.  Discussed each labor is different. Will do our best to achieve VBAC, but cannot make promises. Agrees to emergency interventions as needed. Cultures done

## 2014-07-05 NOTE — Patient Instructions (Signed)

## 2014-07-06 LAB — GC/CHLAMYDIA PROBE AMP
CT PROBE, AMP APTIMA: NEGATIVE
GC Probe RNA: NEGATIVE

## 2014-07-07 ENCOUNTER — Encounter: Payer: Self-pay | Admitting: Family

## 2014-07-07 LAB — CULTURE, BETA STREP (GROUP B ONLY)

## 2014-07-12 ENCOUNTER — Ambulatory Visit (INDEPENDENT_AMBULATORY_CARE_PROVIDER_SITE_OTHER): Admitting: Advanced Practice Midwife

## 2014-07-12 VITALS — BP 119/81 | HR 98 | Wt 201.0 lb

## 2014-07-12 DIAGNOSIS — Z3480 Encounter for supervision of other normal pregnancy, unspecified trimester: Secondary | ICD-10-CM

## 2014-07-12 DIAGNOSIS — Z3483 Encounter for supervision of other normal pregnancy, third trimester: Secondary | ICD-10-CM

## 2014-07-12 LAB — POCT CBG (FASTING - GLUCOSE)-MANUAL ENTRY: Glucose Fasting, POC: 78 mg/dL (ref 70–99)

## 2014-07-12 NOTE — Progress Notes (Signed)
Fasting CBG 78. Vtx by informal US. Discussed fundal height 44. Still declines growth US. Also discussed that it may identify Poly. Risk of SD and possible fetal fetal death emphasized. GBS neg.

## 2014-07-12 NOTE — Patient Instructions (Signed)
Shoulder Dystocia Shoulder dystocia is a situation that can occur during a vaginal delivery. After the baby's head is delivered, the shoulders get stuck at the opening of the vagina and pubic bone. This can be a very serious condition. It occurs in 0.6 to 1.6% of deliveries. This is an emergency situation.  Unfortunately, the caregiver is unable to tell who will have shoulder dystocia before delivery. There is no way to prevent it, since you cannot tell who is going to have the problem. When shoulder dystocia occurs, the mother and baby are at risk for having an injury or complication. Labor should not be caused to start (induced) if there is a suspicion of shoulder dystocia. Signs of shoulder dystocia when in labor include:  Very slow movement of the baby down the birth canal.  "Turtle sign." The baby's head moves in and out of the opening of the vagina during contractions. Shoulder dystocia can cause serious injury to the baby, including:  Fracture of the collarbone (clavicle).  Nerve damage in the shoulder and arm (Erb's palsy).  Injury to the nerves in the baby's neck and shoulder (brachial plexus injury).  Decrease in oxygen, resulting in brain damage and death in some cases.  Muscle and speech disability, resulting from brain damage (cerebral palsy). Injury to the mother may include:  Severe tearing (laceration) of the vagina and rectum.  Laceration of the cervix.  Hemorrhage (heavy bleeding).  Infection.  Separation of the pubic bone, with nerve damage to the area.  Unable to hold and control the stool. Pregnant women who are more likely to get shoulder dystocia are:  Pregnant women with diabetes.  Women with a very small pelvis.  Obese women.  Women with a deformed pelvis, from birth or accident.  Ultrasound evidence of a macrosomic baby (a baby weighing 8 1/2 pounds or more). Studies show a cesarean section is not necessary when:   The mother is only suspected of  having shoulder dystocia.  The mother has had a previous birth with shoulder dystocia. A cesarean section may be needed if the mother has previously had a baby that:  Was smaller than the baby currently in the womb.  Had nerve damage or other serious injury. There are certain techniques that your caregiver has been trained to use when shoulder dystocia occurs. These techniques will help deliver the baby safely. These techniques also keep the mother safe. Postpartum care following shoulder dystocia is not different than any other vaginal delivery, unless there are injuries or complications. Document Released: 05/12/2007 Document Revised: 02/11/2012 Document Reviewed: 10/14/2009 ExitCare Patient Information 2015 [employer]. This information is not intended to replace advice given to you by your health care provider. Make sure you discuss any questions you have with your health care provider.  

## 2014-07-12 NOTE — Progress Notes (Signed)
CBG 78. 

## 2014-07-19 ENCOUNTER — Encounter: Payer: Self-pay | Admitting: Advanced Practice Midwife

## 2014-07-19 ENCOUNTER — Encounter: Payer: Self-pay | Admitting: *Deleted

## 2014-07-19 ENCOUNTER — Ambulatory Visit (INDEPENDENT_AMBULATORY_CARE_PROVIDER_SITE_OTHER): Admitting: Advanced Practice Midwife

## 2014-07-19 VITALS — BP 107/74 | HR 101 | Wt 202.0 lb

## 2014-07-19 DIAGNOSIS — O403XX0 Polyhydramnios, third trimester, not applicable or unspecified: Secondary | ICD-10-CM | POA: Insufficient documentation

## 2014-07-19 DIAGNOSIS — O3660X0 Maternal care for excessive fetal growth, unspecified trimester, not applicable or unspecified: Secondary | ICD-10-CM

## 2014-07-19 DIAGNOSIS — IMO0002 Reserved for concepts with insufficient information to code with codable children: Secondary | ICD-10-CM | POA: Insufficient documentation

## 2014-07-19 DIAGNOSIS — Z3483 Encounter for supervision of other normal pregnancy, third trimester: Secondary | ICD-10-CM

## 2014-07-19 DIAGNOSIS — Z3480 Encounter for supervision of other normal pregnancy, unspecified trimester: Secondary | ICD-10-CM

## 2014-07-19 LAB — POCT CBG (FASTING - GLUCOSE)-MANUAL ENTRY

## 2014-07-19 NOTE — Progress Notes (Signed)
Discussed doing one more fasting CBG next week to reassure absence of overt diabetes in light of polyhydramnios and LGA

## 2014-07-19 NOTE — Progress Notes (Signed)
CBG- 77 

## 2014-07-19 NOTE — Patient Instructions (Signed)
Trial of Labor After Cesarean Delivery Information A trial of labor after cesarean delivery (TOLAC) is when a woman tries to give birth vaginally after a previous cesarean delivery. TOLAC may be a safe and appropriate option for you depending on your medical history and other risk factors. When TOLAC is successful and you are able to have a vaginal delivery, this is called a vaginal birth after cesarean delivery (VBAC).  CANDIDATES FOR TOLAC TOLAC is possible for some women who:  Have undergone one or two prior cesarean deliveries in which the incision of the uterus was horizontal (low transverse).  Are carrying twins and have had one prior low transverse incision during a cesarean delivery.  Do not have a vertical (classical) uterine scar.  Have not had a tear in the wall of their uterus (uterine rupture). TOLAC is also supported for women who meet appropriate criteria and:  Are under the age of 40 years.  Are tall and have a body mass index (BMI) of less than 30.  Have an unknown uterine scar.  Give birth in a facility equipped to handle an emergency cesarean delivery. This team should be able to handle possible complications such as a uterine rupture.  Have thorough counseling about the benefits and risks of TOLAC.  Have discussed future pregnancy plans with their health care provider.  Plan to have several more pregnancies. MOST SUCCESSFUL CANDIDATES FOR TOLAC:  Have had a successful vaginal delivery before or after their cesarean delivery.  Experience labor that begins naturally on or before the due date (40 weeks of gestation).  Do not have a very large (macrosomic) baby.   Had a prior cesarean delivery but are not currently experiencing factors that would prompt a cesarean delivery (such as a breech position).  Had only one prior cesarean delivery.  Had a prior cesarean delivery that was performed early in labor and not after full cervical dilation. TOLAC may be most  appropriate for women who meet the above guidelines and who plan to have more pregnancies. TOLAC is not recommended for home births. LEAST SUCCESSFUL CANDIDATES FOR TOLAC:  Have an induced labor with an unfavorable cervix. An unfavorable cervix is when the cervix is not dilating enough (among other factors).  Have never had a vaginal delivery.  Have had more than two cesarean deliveries.  Have a pregnancy at more than 40 weeks of gestation.  Are pregnant with a baby with a suspected weight greater than 4,000 grams (8 pounds) and who have no prior history of a vaginal delivery.  Have closely spaced pregnancies. SUGGESTED BENEFITS OF TOLAC  You may have a faster recovery time.  You may have a shorter stay in the hospital.  You may have less pain and fewer problems than with a cesarean delivery. Women who have a cesarean delivery have a higher chance of needing blood or getting a fever, an infection, or a blood clot in the legs. SUGGESTED RISKS OF TOLAC The highest risk of complications happens to women who attempt a TOLAC and fail. A failed TOLAC results in an unplanned cesarean delivery. Risks related to TOLAC or repeat cesarean deliveries include:   Blood loss.  Infection.  Blood clot.  Injury to surrounding tissues or organs.  Having to remove the uterus (hysterectomy).  Potential problems with the placenta (such as placenta previa or placenta accreta) in future pregnancies. Although very rare, the main concerns with TOLAC are:  Rupture of the uterine scar from a past cesarean delivery.  Needing an   emergency cesarean delivery.  Having a bad outcome for the baby (perinatal morbidity). FOR MORE INFORMATION American Congress of Obstetricians and Gynecologists: www.acog.org American College of Nurse-Midwives: www.midwife.org Document Released: 08/07/2011 Document Revised: 09/09/2013 Document Reviewed: 05/11/2013 ExitCare Patient Information 2015 [employer]. This  information is not intended to replace advice given to you by your health care provider. Make sure you discuss any questions you have with your health care provider.  

## 2014-07-19 NOTE — Progress Notes (Signed)
Discussed increasing fundal height. Asking about breast pump for stimulation of contractions at home. I do not recommend doing at home unmonitored in a previous C/S, due to risk of rupture in unmonitored situation.  Agrees to AFI today. >>  26.  Discussed this is elevated, will consult Dr Marice Potterove re: need for testing. >>  NST 2x/wk and AFI once per week. She states she cannot do it this week but agrees to do twice weekly testing.

## 2014-07-26 ENCOUNTER — Encounter: Payer: Self-pay | Admitting: *Deleted

## 2014-07-26 ENCOUNTER — Ambulatory Visit (INDEPENDENT_AMBULATORY_CARE_PROVIDER_SITE_OTHER): Admitting: Advanced Practice Midwife

## 2014-07-26 VITALS — BP 126/88 | HR 112 | Wt 202.0 lb

## 2014-07-26 DIAGNOSIS — O409XX0 Polyhydramnios, unspecified trimester, not applicable or unspecified: Secondary | ICD-10-CM

## 2014-07-26 DIAGNOSIS — O3660X0 Maternal care for excessive fetal growth, unspecified trimester, not applicable or unspecified: Secondary | ICD-10-CM

## 2014-07-26 DIAGNOSIS — Z3483 Encounter for supervision of other normal pregnancy, third trimester: Secondary | ICD-10-CM

## 2014-07-26 DIAGNOSIS — Z3480 Encounter for supervision of other normal pregnancy, unspecified trimester: Secondary | ICD-10-CM

## 2014-07-26 LAB — GLUCOSE, POCT (MANUAL RESULT ENTRY): POC Glucose: 79 mg/dl (ref 70–99)

## 2014-07-26 NOTE — Progress Notes (Signed)
CBG 76 

## 2014-07-26 NOTE — Progress Notes (Signed)
Doing well.  Good fetal movement, denies vaginal bleeding, LOF.  Does report some regular contractions through the night last night, but remaining the same, not increasing in intensity.  Did make it difficult to sleep.   Pt continues to exercise, use birth ball, yoga positions to improve chances of vaginal delivery.  Declines cervical exam/membrane sweeping today. Fasting CBG 76 today.  Reviewed signs of labor/reasons to come to hospital.  Preeclampsia precautions reviewed r/t hx with previous pregnancy.  AFI 33 today. NST reactive with regular contractions, mild/moderate to palpation.

## 2014-07-29 ENCOUNTER — Ambulatory Visit (INDEPENDENT_AMBULATORY_CARE_PROVIDER_SITE_OTHER): Admitting: *Deleted

## 2014-07-29 VITALS — BP 119/65 | HR 113 | Wt 203.0 lb

## 2014-07-29 DIAGNOSIS — O335XX0 Maternal care for disproportion due to unusually large fetus, not applicable or unspecified: Secondary | ICD-10-CM

## 2014-07-29 DIAGNOSIS — Z3483 Encounter for supervision of other normal pregnancy, third trimester: Secondary | ICD-10-CM

## 2014-07-29 NOTE — Progress Notes (Signed)
NST only today 

## 2014-08-02 ENCOUNTER — Ambulatory Visit (INDEPENDENT_AMBULATORY_CARE_PROVIDER_SITE_OTHER): Admitting: Advanced Practice Midwife

## 2014-08-02 VITALS — BP 120/75 | HR 94 | Wt 202.0 lb

## 2014-08-02 DIAGNOSIS — IMO0002 Reserved for concepts with insufficient information to code with codable children: Secondary | ICD-10-CM

## 2014-08-02 DIAGNOSIS — O3660X0 Maternal care for excessive fetal growth, unspecified trimester, not applicable or unspecified: Secondary | ICD-10-CM

## 2014-08-02 DIAGNOSIS — O409XX0 Polyhydramnios, unspecified trimester, not applicable or unspecified: Secondary | ICD-10-CM

## 2014-08-02 DIAGNOSIS — Z3480 Encounter for supervision of other normal pregnancy, unspecified trimester: Secondary | ICD-10-CM

## 2014-08-02 LAB — GLUCOSE, POCT (MANUAL RESULT ENTRY): POC Glucose: 78 mg/dl (ref 70–99)

## 2014-08-02 NOTE — Progress Notes (Signed)
Increased contractions. Strongly recommend IOL at 41 weeks. Pt strongly prefers to wait a few days more for spontaneous labor. Does not want C/S. Declined membrane sweeping. Discussed increased risk of stillbirth after 41 weeks and also discussed concerns about further fetal growth. Pt still declined growth [place]. Emphasized that the goal of our practice is for her to have a birth that is healthy and safe for mom and baby. Fetal macrosomia, Hx C/S and prolonged pregnancy in combination make this a very risky birth for both. Again discussed the risk of shoulder dystocia and fetal death.   Random CBG 78.

## 2014-08-02 NOTE — Patient Instructions (Signed)
Labor Induction  Labor induction is when steps are taken to cause a pregnant woman to begin the labor process. Most women go into labor on their own between 37 weeks and 42 weeks of the pregnancy. When this does not happen or when there is a medical need, methods may be used to induce labor. Labor induction causes a pregnant woman's uterus to contract. It also causes the cervix to soften (ripen), open (dilate), and thin out (efface). Usually, labor is not induced before 39 weeks of the pregnancy unless there is a problem with the baby or mother.  Before inducing labor, your health care provider will consider a number of factors, including the following:  The medical condition of you and the baby.   How many weeks along you are.   The status of the baby's lung maturity.   The condition of the cervix.   The position of the baby.  WHAT ARE THE REASONS FOR LABOR INDUCTION? Labor may be induced for the following reasons:  The health of the baby or mother is at risk.   The pregnancy is overdue by 1 week or more.   The water breaks but labor does not start on its own.   The mother has a health condition or serious illness, such as high blood pressure, infection, placental abruption, or diabetes.  The amniotic fluid amounts are low around the baby.   The baby is distressed.  Convenience or wanting the baby to be born on a certain date is not a reason for inducing labor. WHAT METHODS ARE USED FOR LABOR INDUCTION? Several methods of labor induction may be used, such as:   Prostaglandin medicine. This medicine causes the cervix to dilate and ripen. The medicine will also start contractions. It can be taken by mouth or by inserting a suppository into the vagina.   Inserting a thin tube (catheter) with a balloon on the end into the vagina to dilate the cervix. Once inserted, the balloon is expanded with water, which causes the cervix to open.   Stripping the membranes. Your health  care provider separates amniotic sac tissue from the cervix, causing the cervix to be stretched and causing the release of a hormone called progesterone. This may cause the uterus to contract. It is often done during an office visit. You will be sent home to wait for the contractions to begin. You will then come in for an induction.   Breaking the water. Your health care provider makes a hole in the amniotic sac using a small instrument. Once the amniotic sac breaks, contractions should begin. This may still take hours to see an effect.   Medicine to trigger or strengthen contractions. This medicine is given through an IV access tube inserted into a vein in your arm.  All of the methods of induction, besides stripping the membranes, will be done in the hospital. Induction is done in the hospital so that you and the baby can be carefully monitored.  HOW LONG DOES IT TAKE FOR LABOR TO BE INDUCED? Some inductions can take up to 2-3 days. Depending on the cervix, it usually takes less time. It takes longer when you are induced early in the pregnancy or if this is your first pregnancy. If a mother is still pregnant and the induction has been going on for 2-3 days, either the mother will be sent home or a cesarean delivery will be needed. WHAT ARE THE RISKS ASSOCIATED WITH LABOR INDUCTION? Some of the risks of induction   include:   Changes in fetal heart rate, such as too high, too low, or erratic.   Fetal distress.   Chance of infection for the mother and baby.   Increased chance of having a cesarean delivery.   Breaking off (abruption) of the placenta from the uterus (rare).   Uterine rupture (very rare).  When induction is needed for medical reasons, the benefits of induction may outweigh the risks. WHAT ARE SOME REASONS FOR NOT INDUCING LABOR? Labor induction should not be done if:   It is shown that your baby does not tolerate labor.   You have had previous surgeries on your  uterus, such as a myomectomy or the removal of fibroids.   Your placenta lies very low in the uterus and blocks the opening of the cervix (placenta previa).   Your baby is not in a head-down position.   The umbilical cord drops down into the birth canal in front of the baby. This could cut off the baby's blood and oxygen supply.   You have had a previous cesarean delivery.   There are unusual circumstances, such as the baby being extremely premature.  Document Released: 04/10/2007 Document Revised: 07/22/2013 Document Reviewed: 06/18/2013 ExitCare Patient Information 2015 [employer]. This information is not intended to replace advice given to you by your health care provider. Make sure you discuss any questions you have with your health care provider.  

## 2014-08-04 ENCOUNTER — Inpatient Hospital Stay (HOSPITAL_COMMUNITY)
Admission: AD | Admit: 2014-08-04 | Discharge: 2014-08-07 | DRG: 765 | Disposition: A | Discharge status: Home / Self Care | Source: Ambulatory Visit | Attending: Family Medicine | Admitting: Family Medicine

## 2014-08-04 ENCOUNTER — Ambulatory Visit (INDEPENDENT_AMBULATORY_CARE_PROVIDER_SITE_OTHER): Admitting: Obstetrics & Gynecology

## 2014-08-04 ENCOUNTER — Encounter (HOSPITAL_COMMUNITY): Payer: Self-pay | Admitting: *Deleted

## 2014-08-04 VITALS — BP 127/75 | HR 102 | Wt 203.0 lb

## 2014-08-04 DIAGNOSIS — O283 Abnormal ultrasonic finding on antenatal screening of mother: Secondary | ICD-10-CM

## 2014-08-04 DIAGNOSIS — O34219 Maternal care for unspecified type scar from previous cesarean delivery: Secondary | ICD-10-CM | POA: Diagnosis present

## 2014-08-04 DIAGNOSIS — O99891 Other specified diseases and conditions complicating pregnancy: Secondary | ICD-10-CM | POA: Diagnosis present

## 2014-08-04 DIAGNOSIS — Z833 Family history of diabetes mellitus: Secondary | ICD-10-CM

## 2014-08-04 DIAGNOSIS — IMO0002 Reserved for concepts with insufficient information to code with codable children: Secondary | ICD-10-CM

## 2014-08-04 DIAGNOSIS — Z3490 Encounter for supervision of normal pregnancy, unspecified, unspecified trimester: Secondary | ICD-10-CM

## 2014-08-04 DIAGNOSIS — O9902 Anemia complicating childbirth: Secondary | ICD-10-CM | POA: Diagnosis present

## 2014-08-04 DIAGNOSIS — O411090 Infection of amniotic sac and membranes, unspecified, unspecified trimester, not applicable or unspecified: Secondary | ICD-10-CM | POA: Diagnosis present

## 2014-08-04 DIAGNOSIS — Z3480 Encounter for supervision of other normal pregnancy, unspecified trimester: Secondary | ICD-10-CM

## 2014-08-04 DIAGNOSIS — Z3491 Encounter for supervision of normal pregnancy, unspecified, first trimester: Secondary | ICD-10-CM

## 2014-08-04 DIAGNOSIS — O368990 Maternal care for other specified fetal problems, unspecified trimester, not applicable or unspecified: Secondary | ICD-10-CM

## 2014-08-04 DIAGNOSIS — O409XX0 Polyhydramnios, unspecified trimester, not applicable or unspecified: Secondary | ICD-10-CM | POA: Diagnosis present

## 2014-08-04 DIAGNOSIS — D649 Anemia, unspecified: Secondary | ICD-10-CM | POA: Diagnosis present

## 2014-08-04 DIAGNOSIS — O09291 Supervision of pregnancy with other poor reproductive or obstetric history, first trimester: Secondary | ICD-10-CM

## 2014-08-04 DIAGNOSIS — O324XX0 Maternal care for high head at term, not applicable or unspecified: Secondary | ICD-10-CM | POA: Diagnosis present

## 2014-08-04 DIAGNOSIS — Z8249 Family history of ischemic heart disease and other diseases of the circulatory system: Secondary | ICD-10-CM | POA: Diagnosis not present

## 2014-08-04 DIAGNOSIS — O3660X0 Maternal care for excessive fetal growth, unspecified trimester, not applicable or unspecified: Secondary | ICD-10-CM | POA: Diagnosis present

## 2014-08-04 DIAGNOSIS — Z98891 History of uterine scar from previous surgery: Secondary | ICD-10-CM

## 2014-08-04 DIAGNOSIS — O480 Post-term pregnancy: Secondary | ICD-10-CM

## 2014-08-04 LAB — TYPE AND SCREEN
ABO/RH(D): O POS
ANTIBODY SCREEN: NEGATIVE

## 2014-08-04 LAB — CBC
HCT: 36.4 % (ref 36.0–46.0)
HEMOGLOBIN: 12.3 g/dL (ref 12.0–15.0)
MCH: 29.4 pg (ref 26.0–34.0)
MCHC: 33.8 g/dL (ref 30.0–36.0)
MCV: 86.9 fL (ref 78.0–100.0)
Platelets: 159 10*3/uL (ref 150–400)
RBC: 4.19 MIL/uL (ref 3.87–5.11)
RDW: 15 % (ref 11.5–15.5)
WBC: 11.8 10*3/uL — ABNORMAL HIGH (ref 4.0–10.5)

## 2014-08-04 LAB — ABO/RH: ABO/RH(D): O POS

## 2014-08-04 MED ORDER — LACTATED RINGERS IV SOLN
INTRAVENOUS | Status: DC
  Administered 2014-08-05 (×2): via INTRAVENOUS

## 2014-08-04 MED ORDER — OXYTOCIN BOLUS FROM INFUSION: 500.0000 mL | INTRAVENOUS | Status: DC

## 2014-08-04 MED ORDER — PROMETHAZINE HCL 25 MG/ML IJ SOLN
25.0000 mg | Freq: Once | INTRAMUSCULAR | Status: AC
  Administered 2014-08-04: 25 mg via INTRAMUSCULAR
  Filled 2014-08-04: qty 1

## 2014-08-04 MED ORDER — ONDANSETRON HCL 4 MG/2ML IJ SOLN: 4.0000 mg | Freq: Four times a day (QID) | INTRAMUSCULAR | Status: DC | PRN

## 2014-08-04 MED ORDER — ACETAMINOPHEN 325 MG PO TABS: 650.0000 mg | ORAL_TABLET | ORAL | Status: DC | PRN

## 2014-08-04 MED ORDER — FENTANYL CITRATE 0.05 MG/ML IJ SOLN
100.0000 ug | INTRAMUSCULAR | Status: DC | PRN
  Administered 2014-08-04: 100 ug via INTRAVENOUS
  Filled 2014-08-04: qty 2

## 2014-08-04 MED ORDER — LACTATED RINGERS IV SOLN
500.0000 mL | INTRAVENOUS | Status: DC | PRN
  Administered 2014-08-05 (×2): 500 mL via INTRAVENOUS

## 2014-08-04 MED ORDER — CITRIC ACID-SODIUM CITRATE 334-500 MG/5ML PO SOLN
30.0000 mL | ORAL | Status: DC | PRN
  Administered 2014-08-05 (×2): 30 mL via ORAL
  Filled 2014-08-04: qty 15

## 2014-08-04 MED ORDER — LIDOCAINE HCL (PF) 1 % IJ SOLN
30.0000 mL | INTRAMUSCULAR | Status: DC | PRN
  Filled 2014-08-04: qty 30

## 2014-08-04 MED ORDER — PROMETHAZINE HCL 25 MG/ML IJ SOLN: 25.0000 mg | Freq: Four times a day (QID) | INTRAMUSCULAR | Status: DC | PRN

## 2014-08-04 MED ORDER — ACETAMINOPHEN 500 MG PO TABS
1000.0000 mg | ORAL_TABLET | Freq: Four times a day (QID) | ORAL | Status: DC | PRN
  Administered 2014-08-04 – 2014-08-06 (×5): 1000 mg via ORAL
  Filled 2014-08-04 (×4): qty 2

## 2014-08-04 MED ORDER — OXYTOCIN 40 UNITS IN LACTATED RINGERS INFUSION - SIMPLE MED
62.5000 mL/h | INTRAVENOUS | Status: DC
  Filled 2014-08-04 (×2): qty 1000

## 2014-08-04 NOTE — Progress Notes (Signed)
Routine visit. Good FM. NST reactive. EFW at least the size of her last baby (9# 13 ounce). She allowed me to schedule her for IOL this Friday. She requested that I strip her membranes. During this exam she had SROM.

## 2014-08-04 NOTE — Progress Notes (Signed)
Haley Gibson is a 27 y.o. G3P1011 at [redacted]w[redacted]d   Subjective: Coping well with ctx; in H&K on bed now, has been in shower/toilet; doula, friend, and husband providing support  Objective: BP 139/96  Pulse 103  Temp(Src) 97.1 F (36.2 C) (Axillary)  Resp 20  Ht  (1.6 m)  Wt 92.08 kg (203 lb)  BMI 35.97 kg/m2  LMP 10/15/2013      FHT:  FHR: 140s bpm, variability: moderate,  accelerations:  Present,  decelerations:  Absent- occ mi variables UC:   regular, every 3-4 minutes, spont SVE:   Dilation: 6 Effacement (%): 90 Station: -2 Exam by:: Pincus Badder CNM  Labs: Lab Results  Component Value Date   WBC 11.8* 08/04/2014   HGB 12.3 08/04/2014   HCT 36.4 08/04/2014   MCV 86.9 08/04/2014   PLT 159 08/04/2014    Assessment / Plan: Early active labor VBAC  Continue with position changes Desires IV pain meds  Kainat Pizana CNM 08/04/2014, 11:24 PM

## 2014-08-04 NOTE — H&P (Signed)
LABOR ADMISSION HISTORY AND PHYSICAL  Haley Gibson is a 28 y.o. female G3P1011 with IUP at [redacted]w[redacted]d by redating 8w sono presenting for SROM in clinic. She reports +FMs, No LOF, no VB, no blurry vision, headaches or peripheral edema, and RUQ pain. She desires an epidural for labor pain control. She plans on breast feed.  Prefers midwife, would like to wait on pitocin, requests trial of labor with minimal interventions  Dating: By redating 8 week sono --->  Estimated Date of Delivery: 07/28/14  Prenatal History/Complications:  Past Medical History: Past Medical History  Diagnosis Date  . Spinal headache   . Fatigue     chronic  . Anemia     post delivery  . Post partum depression   . Heart palpitations     Past Surgical History: Past Surgical History  Procedure Laterality Date  . Cesarean section    . Wisdom tooth extraction      Obstetrical History: OB History   Grav Para Term Preterm Abortions TAB SAB Ect Mult Living   Previous c/s 2/2 arrest of descent, patient dilated to 8cm, baby ROP and asynclitic.  Social History: History   Social History  . Marital Status: Married    Spouse Name: N/A    Number of Children: 1  . Years of Education: N/A   Occupational History  . homemaker    Social History Main Topics  . Smoking status: Never Smoker   . Smokeless tobacco: Never Used  . Alcohol Use: No  . Drug Use: No  . Sexual Activity: Yes    Partners: Male   Other Topics Concern  . None   Social History Narrative  . None    Family History: Family History  Problem Relation Age of Onset  . Diabetes Maternal Grandfather   . Hypertension Mother   . Thyroid disease Mother   . Fibromyalgia Mother   . Cancer Paternal Grandmother     breast  . Cancer Maternal Grandmother     hodgkin  . Pancreatitis Father   . Heart disease Brother     AS    Allergies: Allergies  Allergen Reactions  . Codeine Diarrhea    Facility-administered  medications prior to admission  Medication Dose Route Frequency Provider Last Rate Last Dose  . Tdap (BOOSTRIX) injection 0.5 mL  0.5 mL Intramuscular Once [place], CNM       Prescriptions prior to admission  Medication Sig Dispense Refill  . Cholecalciferol (VITAMIN D PO) Take 5,000 Units by mouth daily.      . Ferrous Sulfate (IRON SUPPLEMENT PO) Take 1 tablet by mouth daily. Flora Iron Supplement      . MAGNESIUM CITRATE PO Take 150 mg by mouth 2 (two) times daily.      . Omega-3 Fatty Acids (OMEGA 3 PO) Take 1 capsule by mouth daily.      . Prenatal Multivit-Min-Fe-FA (PRENATAL VITAMINS PO) Take 1 capsule by mouth daily.       . Probiotic Product (PROBIOTIC DAILY PO) Take 1 capsule by mouth daily.          Review of Systems   All systems reviewed and negative except as stated in HPI  Blood pressure 129/77, pulse 110, temperature 98.1 F (36.7 C), temperature source Oral, resp. rate 20, height  (1.6 m), weight 203 lb (92.08 kg), last menstrual period 10/15/2013. General appearance: alert and cooperative Lungs: clear  to auscultation bilaterally Heart: regular rate and rhythm Abdomen: soft, non-tender; bowel sounds normal Pelvic: not examined Extremities: Homans sign is negative, no sign of DVT DTR's not examined Presentation: cephalic per Fetal monitoringBaseline: 150 bpm, Variability: Good {> 6 bpm), Accelerations: Reactive and Decelerations: Absent Uterine activity q4-73min      Prenatal labs: ABO, Rh: O/POS/-- (01/19 1001) Antibody: NEG (01/19 1001) Rubella:   RPR: NON REAC (06/01 1042)  HBsAg: NEGATIVE (01/19 1001)  HIV: NONREACTIVE (06/01 1042)  GBS: Negative (08/03 0000)  1 hr Glucola 127 Genetic screening  Negative quad Anatomy US normal, EFW @ 25w 65%    No results found for this or any previous visit (from the past 24 hour(s)).  Patient Active Problem List   Diagnosis Date Noted  . Term pregnancy 08/04/2014  . LGA (large for gestational age)  fetus 07/19/2014  . Polyhydramnios in third trimester, antepartum 07/19/2014  . Ventricular tachycardia 03/31/2014  . Palpitations 03/31/2014  . Dyspnea 03/31/2014  . Echogenic intracardiac focus of fetus on prenatal ultrasound 03/03/2014  . Supervision of normal pregnancy in first trimester 01/18/2014  . Family history of cardiac disorder 01/18/2014  . History of postpartum hemorrhage, currently pregnant in first trimester 01/18/2014  . History of cesarean delivery, currently pregnant 12/21/2013  . History of macrosomia in infant in prior pregnancy, currently pregnant 12/21/2013    Assessment: Haley Gibson is a 28 y.o. G3P1011 at [redacted]w[redacted]d here for SROM  #Labor: requests VBAC, consented, agrees to risks/benefits below.  Will plan for intermittent monitoring until active labor, minimal cervical exams, pt may labor in shower but not water birth.  Will place IV once start continuous monitoring per patient's request #Pain: Tylenol 1gram now, also  phenergan IM; will re-address as needed #FWB: Cat I #ID:  GBS neg #MOF: breast #MOC: will address with patient #Circ:  circ OP   Risk of uterine rupture at term is 0.78 percent with TOLAC and 0.22 percent with ERCD. 1 in 10 uterine ruptures will result in neonatal death or neurological injury. The benefits of a trial of labor after cesarean (TOLAC) resulting in a vaginal birth after cesarean (VBAC) include the following: shorter length of hospital stay and postpartum recovery (in most cases); fewer complications, such as postpartum fever, wound or uterine infection, thromboembolism (blood clots in the leg or lung), need for blood transfusion and fewer neonatal breathing problems. The risks of an attempted VBAC or TOLAC include the following: Risk of failed trial of labor after cesarean (TOLAC) without a vaginal birth after cesarean (VBAC) resulting in repeat cesarean delivery (RCD) in about 20 to 40 percent of women who attempt VBAC.  Risk  of rupture of uterus resulting in an emergency cesarean delivery. The risk of uterine rupture may be related in part to the type of uterine incision made during the first cesarean delivery. A previous transverse uterine incision has the lowest risk of rupture (0.2 to 1.5 percent risk). Vertical or T-shaped uterine incisions have a higher risk of uterine rupture (4 to 9 percent risk)The risk of fetal death is very low with both VBAC and elective repeat cesarean delivery (ERCD), but the likelihood of fetal death is higher with VBAC than with ERCD. Maternal death is very rare with either type of delivery. The risks of an elective repeat cesarean delivery (ERCD) were reviewed with the patient including but not limited to: 01/999 risk of uterine rupture which could have serious consequences, bleeding which may require transfusion; infection which may require antibiotics; injury  to bowel, bladder or other surrounding organs (bowel, bladder, ureters); injury to the fetus; need for additional procedures including hysterectomy in the event of a life-threatening hemorrhage; thromboembolic phenomenon; abnormal placentation; incisional problems; death and other postoperative or anesthesia complications.    These risks and benefits are summarized on the consent form, which was reviewed with the patient during the visit.  All her questions answered and she signed a consent indicating a preference for TOLAC/ERCD. A copy of the consent was given to the patient.   Leon Goodnow ROCIO 08/04/2014, 2:01 PM

## 2014-08-04 NOTE — Progress Notes (Signed)
LABOR PROGRESS NOTE  Haley Gibson is a 28 y.o. G3P1011 at [redacted]w[redacted]d  admitted for rupture of membranes  Subjective: Feeling contractions, declines pitocin  Objective: BP 139/96  Pulse 103  Temp(Src) 97.1 F (36.2 C) (Axillary)  Resp 20  Ht  (1.6 m)  Wt 203 lb (92.08 kg)  BMI 35.97 kg/m2  LMP 10/15/2013 or  Filed Vitals:   08/04/14 1702 08/04/14 1703 08/04/14 1838 08/04/14 1933  BP:  139/74 128/81 139/96  Pulse:  89 113 103  Temp: 98.6 F (37 C)  98.5 F (36.9 C) 97.1 F (36.2 C)  TempSrc: Oral  Oral Axillary  Resp:      Height:      Weight:           FHT:  FHR: 150 bpm, variability: moderate,  accelerations:  Present,  decelerations:  Absent UC:   q3-45min, poor toco tracing SVE:   Dilation: 5 Effacement (%): 90 Station: -1 Exam by:: Dr. Loreta Ave  Dilation: 5 Effacement (%): 90 Cervical Position: Middle Station: -1 Presentation: Vertex Exam by:: Dr. Loreta Ave  Labs: Lab Results  Component Value Date   WBC 11.8* 08/04/2014   HGB 12.3 08/04/2014   HCT 36.4 08/04/2014   MCV 86.9 08/04/2014   PLT 159 08/04/2014    Assessment / Plan: Spontaneous labor, progressing normally  Labor: Progressing normally, discussed starting pitocin for augmentation given slow change since SROM in clinic at 1000, would prefer to not start pitocin at this time.  Will re-examine patient at 2200 Fetal Wellbeing:  Category I Pain Control:  Labor support without medications, medications/epidural available upon request Anticipated MOD:  NSVD  Perry Mount, MD 08/04/2014, 7:33 PM

## 2014-08-04 NOTE — Progress Notes (Signed)
Pt in shower.  Instructed to come out for monitoring

## 2014-08-04 NOTE — Progress Notes (Signed)
Patient out of shower, monitors reapplied

## 2014-08-05 ENCOUNTER — Telehealth: Payer: Self-pay | Admitting: *Deleted

## 2014-08-05 ENCOUNTER — Inpatient Hospital Stay (HOSPITAL_COMMUNITY): Admitting: Anesthesiology

## 2014-08-05 ENCOUNTER — Encounter (HOSPITAL_COMMUNITY): Payer: Self-pay | Admitting: Anesthesiology

## 2014-08-05 ENCOUNTER — Encounter (HOSPITAL_COMMUNITY)
Admission: AD | Disposition: A | Discharge status: Home / Self Care | Payer: Self-pay | Source: Ambulatory Visit | Attending: Family Medicine

## 2014-08-05 ENCOUNTER — Encounter (HOSPITAL_COMMUNITY): Admitting: Anesthesiology

## 2014-08-05 DIAGNOSIS — O4290 Premature rupture of membranes, unspecified as to length of time between rupture and onset of labor, unspecified weeks of gestation: Secondary | ICD-10-CM

## 2014-08-05 DIAGNOSIS — O409XX0 Polyhydramnios, unspecified trimester, not applicable or unspecified: Secondary | ICD-10-CM

## 2014-08-05 DIAGNOSIS — O411090 Infection of amniotic sac and membranes, unspecified, unspecified trimester, not applicable or unspecified: Secondary | ICD-10-CM

## 2014-08-05 DIAGNOSIS — Z98891 History of uterine scar from previous surgery: Secondary | ICD-10-CM

## 2014-08-05 LAB — RPR

## 2014-08-05 SURGERY — Surgical Case
Anesthesia: Epidural

## 2014-08-05 MED ORDER — ZOLPIDEM TARTRATE 5 MG PO TABS: 5.0000 mg | ORAL_TABLET | Freq: Every evening | ORAL | Status: DC | PRN

## 2014-08-05 MED ORDER — ONDANSETRON HCL 4 MG/2ML IJ SOLN: 4.0000 mg | INTRAMUSCULAR | Status: DC | PRN

## 2014-08-05 MED ORDER — ONDANSETRON HCL 4 MG/2ML IJ SOLN
INTRAMUSCULAR | Status: DC | PRN
  Administered 2014-08-05: 4 mg via INTRAVENOUS

## 2014-08-05 MED ORDER — OXYTOCIN 40 UNITS IN LACTATED RINGERS INFUSION - SIMPLE MED: 62.5000 mL/h | INTRAVENOUS | Status: AC

## 2014-08-05 MED ORDER — LACTATED RINGERS IV SOLN: INTRAVENOUS | Status: DC

## 2014-08-05 MED ORDER — FENTANYL 2.5 MCG/ML BUPIVACAINE 1/10 % EPIDURAL INFUSION (WH - ANES)
INTRAMUSCULAR | Status: DC | PRN
  Administered 2014-08-05: 14 mL/h via EPIDURAL

## 2014-08-05 MED ORDER — DIPHENHYDRAMINE HCL 25 MG PO CAPS: 25.0000 mg | ORAL_CAPSULE | ORAL | Status: DC | PRN

## 2014-08-05 MED ORDER — LANOLIN HYDROUS EX OINT
1.0000 | TOPICAL_OINTMENT | CUTANEOUS | Status: DC | PRN
Start: 2014-08-05 — End: 2014-08-07

## 2014-08-05 MED ORDER — SODIUM CHLORIDE 0.9 % IJ SOLN
3.0000 mL | INTRAMUSCULAR | Status: DC | PRN
  Administered 2014-08-06: 3 mL via INTRAVENOUS

## 2014-08-05 MED ORDER — MEPERIDINE HCL 25 MG/ML IJ SOLN
INTRAMUSCULAR | Status: AC
  Filled 2014-08-05: qty 1

## 2014-08-05 MED ORDER — KETOROLAC TROMETHAMINE 30 MG/ML IJ SOLN
30.0000 mg | Freq: Four times a day (QID) | INTRAMUSCULAR | Status: AC | PRN
  Administered 2014-08-05: 30 mg via INTRAVENOUS

## 2014-08-05 MED ORDER — MIDAZOLAM HCL 2 MG/2ML IJ SOLN: 0.5000 mg | Freq: Once | INTRAMUSCULAR | Status: DC | PRN

## 2014-08-05 MED ORDER — GENTAMICIN SULFATE 40 MG/ML IJ SOLN
160.0000 mg | Freq: Three times a day (TID) | INTRAVENOUS | Status: DC
  Administered 2014-08-05: 160 mg via INTRAVENOUS
  Filled 2014-08-05 (×3): qty 4

## 2014-08-05 MED ORDER — FENTANYL 2.5 MCG/ML BUPIVACAINE 1/10 % EPIDURAL INFUSION (WH - ANES)
14.0000 mL/h | INTRAMUSCULAR | Status: DC | PRN
  Administered 2014-08-05: 14 mL/h via EPIDURAL
  Filled 2014-08-05 (×2): qty 125

## 2014-08-05 MED ORDER — ONDANSETRON HCL 4 MG/2ML IJ SOLN: 4.0000 mg | Freq: Three times a day (TID) | INTRAMUSCULAR | Status: DC | PRN

## 2014-08-05 MED ORDER — PHENYLEPHRINE 40 MCG/ML (10ML) SYRINGE FOR IV PUSH (FOR BLOOD PRESSURE SUPPORT)
80.0000 ug | PREFILLED_SYRINGE | INTRAVENOUS | Status: DC | PRN
  Filled 2014-08-05: qty 10

## 2014-08-05 MED ORDER — PHENYLEPHRINE 40 MCG/ML (10ML) SYRINGE FOR IV PUSH (FOR BLOOD PRESSURE SUPPORT)
PREFILLED_SYRINGE | INTRAVENOUS | Status: AC
  Filled 2014-08-05: qty 5

## 2014-08-05 MED ORDER — LACTATED RINGERS IV SOLN
INTRAVENOUS | Status: DC | PRN
  Administered 2014-08-05 (×3): via INTRAVENOUS

## 2014-08-05 MED ORDER — SENNOSIDES-DOCUSATE SODIUM 8.6-50 MG PO TABS
2.0000 | ORAL_TABLET | ORAL | Status: DC
  Administered 2014-08-06: 2 via ORAL
  Filled 2014-08-05: qty 2

## 2014-08-05 MED ORDER — PROMETHAZINE HCL 25 MG/ML IJ SOLN: 6.2500 mg | INTRAMUSCULAR | Status: DC | PRN

## 2014-08-05 MED ORDER — PHENYLEPHRINE 40 MCG/ML (10ML) SYRINGE FOR IV PUSH (FOR BLOOD PRESSURE SUPPORT): 80.0000 ug | PREFILLED_SYRINGE | INTRAVENOUS | Status: DC | PRN

## 2014-08-05 MED ORDER — CEFAZOLIN SODIUM-DEXTROSE 2-3 GM-% IV SOLR
2.0000 g | INTRAVENOUS | Status: AC
  Administered 2014-08-05: 2 g via INTRAVENOUS
  Filled 2014-08-05: qty 50

## 2014-08-05 MED ORDER — NALOXONE HCL 0.4 MG/ML IJ SOLN: 0.4000 mg | INTRAMUSCULAR | Status: DC | PRN

## 2014-08-05 MED ORDER — METOCLOPRAMIDE HCL 5 MG/ML IJ SOLN: 10.0000 mg | Freq: Three times a day (TID) | INTRAMUSCULAR | Status: DC | PRN

## 2014-08-05 MED ORDER — NALBUPHINE HCL 10 MG/ML IJ SOLN: 5.0000 mg | INTRAMUSCULAR | Status: DC | PRN

## 2014-08-05 MED ORDER — DIPHENHYDRAMINE HCL 50 MG/ML IJ SOLN: 25.0000 mg | INTRAMUSCULAR | Status: DC | PRN

## 2014-08-05 MED ORDER — IBUPROFEN 600 MG PO TABS: 600.0000 mg | ORAL_TABLET | Freq: Four times a day (QID) | ORAL | Status: DC | PRN

## 2014-08-05 MED ORDER — PHENYLEPHRINE HCL 10 MG/ML IJ SOLN
INTRAMUSCULAR | Status: DC | PRN
  Administered 2014-08-05 (×3): 80 ug via INTRAVENOUS
  Administered 2014-08-05: 40 ug via INTRAVENOUS
  Administered 2014-08-05 (×3): 80 ug via INTRAVENOUS
  Administered 2014-08-05: 40 ug via INTRAVENOUS

## 2014-08-05 MED ORDER — DIPHENHYDRAMINE HCL 50 MG/ML IJ SOLN: 12.5000 mg | INTRAMUSCULAR | Status: DC | PRN

## 2014-08-05 MED ORDER — PRENATAL MULTIVITAMIN CH
1.0000 | ORAL_TABLET | Freq: Every day | ORAL | Status: DC
  Administered 2014-08-06 – 2014-08-07 (×2): 1 via ORAL
  Filled 2014-08-05 (×2): qty 1

## 2014-08-05 MED ORDER — DIBUCAINE 1 % RE OINT: 1.0000 "application " | TOPICAL_OINTMENT | RECTAL | Status: DC | PRN

## 2014-08-05 MED ORDER — SIMETHICONE 80 MG PO CHEW
80.0000 mg | CHEWABLE_TABLET | Freq: Three times a day (TID) | ORAL | Status: DC
  Administered 2014-08-06 – 2014-08-07 (×6): 80 mg via ORAL
  Filled 2014-08-05 (×6): qty 1

## 2014-08-05 MED ORDER — DIPHENHYDRAMINE HCL 50 MG/ML IJ SOLN
12.5000 mg | INTRAMUSCULAR | Status: DC | PRN
  Administered 2014-08-05: 12.5 mg via INTRAVENOUS
  Filled 2014-08-05: qty 1

## 2014-08-05 MED ORDER — OXYTOCIN 10 UNIT/ML IJ SOLN
INTRAMUSCULAR | Status: AC
  Filled 2014-08-05: qty 4

## 2014-08-05 MED ORDER — NALBUPHINE HCL 10 MG/ML IJ SOLN
5.0000 mg | INTRAMUSCULAR | Status: DC | PRN
  Administered 2014-08-05 – 2014-08-06 (×2): 10 mg via SUBCUTANEOUS
  Filled 2014-08-05 (×2): qty 1

## 2014-08-05 MED ORDER — LIDOCAINE-EPINEPHRINE (PF) 2 %-1:200000 IJ SOLN
INTRAMUSCULAR | Status: AC
  Filled 2014-08-05: qty 20

## 2014-08-05 MED ORDER — IBUPROFEN 600 MG PO TABS
600.0000 mg | ORAL_TABLET | Freq: Four times a day (QID) | ORAL | Status: DC
  Administered 2014-08-06 – 2014-08-07 (×7): 600 mg via ORAL
  Filled 2014-08-05 (×7): qty 1

## 2014-08-05 MED ORDER — SCOPOLAMINE 1 MG/3DAYS TD PT72: 1.0000 | MEDICATED_PATCH | Freq: Once | TRANSDERMAL | Status: DC

## 2014-08-05 MED ORDER — EPHEDRINE 5 MG/ML INJ: 10.0000 mg | INTRAVENOUS | Status: DC | PRN

## 2014-08-05 MED ORDER — OXYTOCIN 40 UNITS IN LACTATED RINGERS INFUSION - SIMPLE MED
1.0000 m[IU]/min | INTRAVENOUS | Status: DC
  Administered 2014-08-05: 1 m[IU]/min via INTRAVENOUS

## 2014-08-05 MED ORDER — ONDANSETRON HCL 4 MG PO TABS: 4.0000 mg | ORAL_TABLET | ORAL | Status: DC | PRN

## 2014-08-05 MED ORDER — NALOXONE HCL 1 MG/ML IJ SOLN: 1.0000 ug/kg/h | INTRAVENOUS | Status: DC | PRN

## 2014-08-05 MED ORDER — OXYTOCIN 10 UNIT/ML IJ SOLN
40.0000 [IU] | INTRAVENOUS | Status: DC | PRN
  Administered 2014-08-05: 40 [IU] via INTRAVENOUS

## 2014-08-05 MED ORDER — OXYCODONE-ACETAMINOPHEN 5-325 MG PO TABS: 2.0000 | ORAL_TABLET | ORAL | Status: DC | PRN

## 2014-08-05 MED ORDER — MORPHINE SULFATE (PF) 0.5 MG/ML IJ SOLN
INTRAMUSCULAR | Status: DC | PRN
  Administered 2014-08-05: 4 mg via EPIDURAL

## 2014-08-05 MED ORDER — FENTANYL 2.5 MCG/ML BUPIVACAINE 1/10 % EPIDURAL INFUSION (WH - ANES)
14.0000 mL/h | INTRAMUSCULAR | Status: DC | PRN
  Administered 2014-08-05: 14 mL/h via EPIDURAL

## 2014-08-05 MED ORDER — KETOROLAC TROMETHAMINE 30 MG/ML IJ SOLN: 30.0000 mg | Freq: Four times a day (QID) | INTRAMUSCULAR | Status: AC | PRN

## 2014-08-05 MED ORDER — TETANUS-DIPHTH-ACELL PERTUSSIS 5-2.5-18.5 LF-MCG/0.5 IM SUSP
0.5000 mL | Freq: Once | INTRAMUSCULAR | Status: DC
Start: 2014-08-06 — End: 2014-08-05

## 2014-08-05 MED ORDER — SODIUM BICARBONATE 8.4 % IV SOLN
INTRAVENOUS | Status: AC
  Filled 2014-08-05: qty 50

## 2014-08-05 MED ORDER — FENTANYL CITRATE 0.05 MG/ML IJ SOLN: 25.0000 ug | INTRAMUSCULAR | Status: DC | PRN

## 2014-08-05 MED ORDER — KETOROLAC TROMETHAMINE 30 MG/ML IJ SOLN
INTRAMUSCULAR | Status: AC
  Filled 2014-08-05: qty 1

## 2014-08-05 MED ORDER — SIMETHICONE 80 MG PO CHEW
80.0000 mg | CHEWABLE_TABLET | ORAL | Status: DC | PRN
Start: 2014-08-05 — End: 2014-08-07

## 2014-08-05 MED ORDER — DEXTROSE 5 % IV SOLN
1.0000 g | Freq: Two times a day (BID) | INTRAVENOUS | Status: AC
  Administered 2014-08-05 – 2014-08-06 (×2): 1 g via INTRAVENOUS
  Filled 2014-08-05 (×2): qty 1

## 2014-08-05 MED ORDER — MEPERIDINE HCL 25 MG/ML IJ SOLN
6.2500 mg | INTRAMUSCULAR | Status: DC | PRN
  Administered 2014-08-05: 12.5 mg via INTRAVENOUS

## 2014-08-05 MED ORDER — SODIUM BICARBONATE 8.4 % IV SOLN
INTRAVENOUS | Status: DC | PRN
  Administered 2014-08-05: 10 mL via EPIDURAL

## 2014-08-05 MED ORDER — SIMETHICONE 80 MG PO CHEW
80.0000 mg | CHEWABLE_TABLET | ORAL | Status: DC
  Administered 2014-08-06: 80 mg via ORAL
  Filled 2014-08-05: qty 1

## 2014-08-05 MED ORDER — LACTATED RINGERS IV SOLN
500.0000 mL | Freq: Once | INTRAVENOUS | Status: AC
  Administered 2014-08-05: 500 mL via INTRAVENOUS

## 2014-08-05 MED ORDER — DIPHENHYDRAMINE HCL 25 MG PO CAPS: 25.0000 mg | ORAL_CAPSULE | Freq: Four times a day (QID) | ORAL | Status: DC | PRN

## 2014-08-05 MED ORDER — ACETAMINOPHEN 500 MG PO TABS
1000.0000 mg | ORAL_TABLET | Freq: Four times a day (QID) | ORAL | Status: AC
  Administered 2014-08-06: 1000 mg via ORAL
  Filled 2014-08-05 (×2): qty 2

## 2014-08-05 MED ORDER — MEPERIDINE HCL 25 MG/ML IJ SOLN: 6.2500 mg | INTRAMUSCULAR | Status: DC | PRN

## 2014-08-05 MED ORDER — WITCH HAZEL-GLYCERIN EX PADS: 1.0000 "application " | MEDICATED_PAD | CUTANEOUS | Status: DC | PRN

## 2014-08-05 MED ORDER — ONDANSETRON HCL 4 MG/2ML IJ SOLN
INTRAMUSCULAR | Status: AC
  Filled 2014-08-05: qty 2

## 2014-08-05 MED ORDER — OXYCODONE-ACETAMINOPHEN 5-325 MG PO TABS: 1.0000 | ORAL_TABLET | ORAL | Status: DC | PRN

## 2014-08-05 MED ORDER — LIDOCAINE HCL (PF) 1 % IJ SOLN
INTRAMUSCULAR | Status: DC | PRN
  Administered 2014-08-05 (×2): 4 mL

## 2014-08-05 MED ORDER — MENTHOL 3 MG MT LOZG: 1.0000 | LOZENGE | OROMUCOSAL | Status: DC | PRN

## 2014-08-05 MED ORDER — DEXTROSE 5 % IV SOLN
180.0000 mg | Freq: Once | INTRAVENOUS | Status: AC
  Administered 2014-08-05: 180 mg via INTRAVENOUS
  Filled 2014-08-05: qty 4.5

## 2014-08-05 MED ORDER — SODIUM CHLORIDE 0.9 % IV SOLN
2.0000 g | Freq: Four times a day (QID) | INTRAVENOUS | Status: DC
  Administered 2014-08-05 (×3): 2 g via INTRAVENOUS
  Filled 2014-08-05 (×5): qty 2000

## 2014-08-05 MED ORDER — TERBUTALINE SULFATE 1 MG/ML IJ SOLN: 0.2500 mg | Freq: Once | INTRAMUSCULAR | Status: DC | PRN

## 2014-08-05 MED ORDER — LACTATED RINGERS IV SOLN
INTRAVENOUS | Status: DC | PRN
  Administered 2014-08-05: 18:00:00 via INTRAVENOUS

## 2014-08-05 MED ORDER — MORPHINE SULFATE 0.5 MG/ML IJ SOLN
INTRAMUSCULAR | Status: AC
  Filled 2014-08-05: qty 10

## 2014-08-05 SURGICAL SUPPLY — 31 items
BARRIER ADHS 3X4 INTERCEED (GAUZE/BANDAGES/DRESSINGS) IMPLANT
BLADE SURG 10 STRL SS (BLADE) ×6 IMPLANT
CLAMP CORD UMBIL (MISCELLANEOUS) IMPLANT
CLOTH BEACON ORANGE TIMEOUT ST (SAFETY) ×3 IMPLANT
DRAPE LG THREE QUARTER DISP (DRAPES) IMPLANT
DRSG OPSITE POSTOP 4X10 (GAUZE/BANDAGES/DRESSINGS) ×3 IMPLANT
DURAPREP 26ML APPLICATOR (WOUND CARE) ×3 IMPLANT
ELECT REM PT RETURN 9FT ADLT (ELECTROSURGICAL) ×3
ELECTRODE REM PT RTRN 9FT ADLT (ELECTROSURGICAL) ×1 IMPLANT
EXTRACTOR VACUUM KIWI (MISCELLANEOUS) IMPLANT
GLOVE BIO SURGEON STRL SZ 6.5 (GLOVE) ×2 IMPLANT
GLOVE BIO SURGEONS STRL SZ 6.5 (GLOVE) ×1
GLOVE BIOGEL PI IND STRL 7.0 (GLOVE) ×1 IMPLANT
GLOVE BIOGEL PI INDICATOR 7.0 (GLOVE) ×2
GOWN STRL REUS W/TWL LRG LVL3 (GOWN DISPOSABLE) ×6 IMPLANT
KIT ABG SYR 3ML LUER SLIP (SYRINGE) IMPLANT
NEEDLE HYPO 25X5/8 SAFETYGLIDE (NEEDLE) IMPLANT
NS IRRIG 1000ML POUR BTL (IV SOLUTION) ×3 IMPLANT
PACK C SECTION WH (CUSTOM PROCEDURE TRAY) ×3 IMPLANT
PAD OB MATERNITY 4.3X12.25 (PERSONAL CARE ITEMS) ×3 IMPLANT
RETRACTOR WND ALEXIS 25 LRG (MISCELLANEOUS) ×1 IMPLANT
RTRCTR WOUND ALEXIS 25CM LRG (MISCELLANEOUS) ×3
SUT PLAIN 2 0 (SUTURE) ×2
SUT PLAIN ABS 2-0 CT1 27XMFL (SUTURE) ×1 IMPLANT
SUT VIC AB 0 CT1 36 (SUTURE) ×18 IMPLANT
SUT VIC AB 2-0 CT1 27 (SUTURE) ×2
SUT VIC AB 2-0 CT1 TAPERPNT 27 (SUTURE) ×1 IMPLANT
SUT VIC AB 4-0 PS2 27 (SUTURE) ×3 IMPLANT
TOWEL OR 17X24 6PK STRL BLUE (TOWEL DISPOSABLE) ×3 IMPLANT
TRAY FOLEY CATH 14FR (SET/KITS/TRAYS/PACK) IMPLANT
WATER STERILE IRR 1000ML POUR (IV SOLUTION) ×3 IMPLANT

## 2014-08-05 NOTE — H&P (Signed)
Attestation of Attending Supervision of Obstetric Fellow: Evaluation and management procedures were performed by the Obstetric Fellow under my supervision and collaboration.  I have reviewed the Obstetric Fellow's note and chart, and I agree with the management and plan.  Carlous Olivares, DO Attending Physician Faculty Practice, [facility]  

## 2014-08-05 NOTE — Progress Notes (Addendum)
Haley Gibson is a 28 y.o. G3P1011 at [redacted]w[redacted]d.  Subjective: Mild pressure w/ UC's. Feels like she can feel the baby's head much lower.   Objective: BP 124/84  Pulse 144  Temp(Src) 98.6 F (37 C) (Oral)  Resp 18  Ht  (1.6 m)  Wt 92.08 kg (203 lb)  BMI 35.97 kg/m2  SpO2 99%  LMP 10/15/2013 I/O last 3 completed shifts: In: -  Out: 600 [Urine:600] Total I/O In: -  Out: 1700 [Urine:1700]  FHT:  FHR: 155 bpm, variability: moderate,  accelerations:  Present,  decelerations:  Present mild variables, earlies UC:   regular, every 2-4 minutes, mod-strong. MVUs 45-110 SVE:   Dilation: 10 Effacement (%): 100 Station: +1 Exam by:: [place], cnm  Labs: Lab Results  Component Value Date   WBC 11.8* 08/04/2014   HGB 12.3 08/04/2014   HCT 36.4 08/04/2014   MCV 86.9 08/04/2014   PLT 159 08/04/2014    Assessment / Plan: Protracted active phase  Labor: Progressing on Pitocin, will continue to increase then AROM Preeclampsia:  NA Fetal Wellbeing:  Category I and Category II Pain Control:  Epidural I/D:  n/a Anticipated MOD:  NSVD Attempted pushing. Pt not feeling anything. Will labor down x 1 hour. Again reviewed increased risk of shoulder dystocia. Pt wishes to proceed w/ TOLAC.  Dr.Arnold updated.   Haley Gibson 08/05/2014, 3:26 PM

## 2014-08-05 NOTE — Progress Notes (Signed)
Pt using nipple stimulation to augment contractions.  Pt instructed by midwife to do one side for one contraction and then take a break.

## 2014-08-05 NOTE — Anesthesia Preprocedure Evaluation (Signed)
Anesthesia Evaluation  Patient identified by MRN, date of birth, ID band Patient awake    Reviewed: Allergy & Precautions, H&P , NPO status , Patient's Chart, lab work & pertinent test results  History of Anesthesia Complications (+) POST - OP SPINAL HEADACHE  Airway Mallampati: II TM Distance: >3 FB Neck ROM: Full    Dental no notable dental hx.    Pulmonary shortness of breath,  breath sounds clear to auscultation  Pulmonary exam normal       Cardiovascular negative cardio ROS  Rhythm:Regular Rate:Normal     Neuro/Psych  Headaches, PSYCHIATRIC DISORDERS    GI/Hepatic negative GI ROS, Neg liver ROS,   Endo/Other  negative endocrine ROS  Renal/GU negative Renal ROS     Musculoskeletal negative musculoskeletal ROS (+)   Abdominal (+) + obese,   Peds  Hematology  (+) anemia ,   Anesthesia Other Findings   Reproductive/Obstetrics negative OB ROS                           Anesthesia Physical Anesthesia Plan  ASA: II  Anesthesia Plan: Epidural   Post-op Pain Management:    Induction:   Airway Management Planned:   Additional Equipment:   Intra-op Plan:   Post-operative Plan:   Informed Consent: I have reviewed the patients History and Physical, chart, labs and discussed the procedure including the risks, benefits and alternatives for the proposed anesthesia with the patient or authorized representative who has indicated his/her understanding and acceptance.     Plan Discussed with:   Anesthesia Plan Comments:         Anesthesia Quick Evaluation

## 2014-08-05 NOTE — Progress Notes (Signed)
ANTIBIOTIC CONSULT NOTE - INITIAL Pharmacy Consult for gentamicin Indication: chorioamnionitis  Allergies  Allergen Reactions  . Codeine Diarrhea    Patient Measurements: Height:  (160 cm) Weight: 203 lb (92.08 kg) IBW/kg (Calculated) : 52.4 Adjusted Body Weight: 65 kg  Vital Signs: Temp: 100.6 F (38.1 C) (09/03 0405) Temp src: Axillary (09/03 0405) BP: 112/72 mmHg (09/03 0401) Pulse Rate: 126 (09/03 0401) Intake/Output from previous day:   Intake/Output from this shift:    Labs:  Recent Labs  08/04/14 1548  WBC 11.8*  HGB 12.3  PLT 159   Estimated Creatinine Clearance: 112.9 ml/min (by C-G formula based on Cr of 0.45). No results found for this basename: VANCOTROUGH, VANCOPEAK, VANCORANDOM, GENTTROUGH, GENTPEAK, GENTRANDOM, TOBRATROUGH, TOBRAPEAK, TOBRARND, AMIKACINPEAK, AMIKACINTROU, AMIKACIN,  in the last 72 hours   Microbiology: No results found for this or any previous visit (from the past 720 hour(s)).  Medical History: Past Medical History  Diagnosis Date  . Spinal headache   . Fatigue     chronic  . Anemia     post delivery  . Post partum depression   . Heart palpitations     Medications:  Scheduled:  . ampicillin (OMNIPEN) IV  2 g Intravenous Q6H  . gentamicin  160 mg Intravenous Q8H  . gentamicin  180 mg Intravenous Once   Infusions:  . fentaNYL 2.5 mcg/ml w/bupivacaine 1/10% in NS epidural infusion ( total) 14 mL/hr (08/05/14 0150)  . fentaNYL 2.5 mcg/ml w/bupivacaine 1/10% in NS epidural infusion ( total)    . lactated ringers 125 mL/hr at 08/05/14 0150  . oxytocin 40 units in LR 1000 mL    . oxytocin 40 units in LR 1000 mL     PRN: acetaminophen, citric acid-sodium citrate, diphenhydrAMINE, ePHEDrine, ePHEDrine, fentaNYL, fentaNYL 2.5 mcg/ml w/bupivacaine 1/10% in NS epidural infusion ( total), fentaNYL 2.5 mcg/ml w/bupivacaine 1/10% in NS epidural infusion ( total), lactated ringers,  lidocaine (PF), ondansetron, phenylephrine, phenylephrine, promethazine  Assessment: 68yr female term pregnancy now with sx's of chorioamnionitis - to be treated with ampicillin and gentamicin  Goal of Therapy:  Desire gentamicin serum peak level 6-42mcg/ml and trough <2mcg/ml  Plan:  1. Gentamicin  IV x 1 loading dose, (and if continued) 2. Gentamicin  IV q8h  Scarlett Presto 08/05/2014,4:29 AM

## 2014-08-05 NOTE — Progress Notes (Signed)
Haley Gibson is a 28 y.o. G3P1011 at [redacted]w[redacted]d   Subjective: Comfortable with epidural; rec'd abx  Objective: BP 113/69  Pulse 139  Temp(Src) 100.1 F (37.8 C) (Axillary)  Resp 20  Ht  (1.6 m)  Wt 92.08 kg (203 lb)  BMI 35.97 kg/m2  SpO2 99%  LMP 10/15/2013   Total I/O In: -  Out: 600 [Urine:600]  FHT:  FHR: 160s bpm, variability: moderate,  accelerations:  Present,  decelerations:  Absent- occ mi variables UC:   regular, every 3-4 minutes SVE:   Dilation: Lip/rim Effacement (%): 100 Station: -1 Exam by:: [employer]   Labs: Lab Results  Component Value Date   WBC 11.8* 08/04/2014   HGB 12.3 08/04/2014   HCT 36.4 08/04/2014   MCV 86.9 08/04/2014   PLT 159 08/04/2014    Assessment / Plan: Active labor VBAC Fever  Reeval cx in 2 hrs for change  Cam Hai CNM 08/05/2014, 6:38 AM

## 2014-08-05 NOTE — Progress Notes (Signed)
Haley Gibson is a 28 y.o. G3P1011 at [redacted]w[redacted]d.  Subjective: Mild pressure w/ UC's.   Objective: BP 109/75  Pulse 141  Temp(Src) 99.1 F (37.3 C) (Axillary)  Resp 18  Ht  (1.6 m)  Wt 92.08 kg (203 lb)  BMI 35.97 kg/m2  SpO2 99%  LMP 10/15/2013 I/O last 3 completed shifts: In: -  Out: 600 [Urine:600] Total I/O In: -  Out: 1200 [Urine:1200]  FHT:  FHR: 170-180 bpm, variability: minimal-moderate,  accelerations:  Present,  decelerations:  Present mild varibales and earlies.  UC:   irregular, every 2-5 minutes, moderate SVE:   Dilation: 9 Effacement (%): 100 Station: -1 Exam by:: Haley Gibson, cnm IUPC placed w/out difficulty  Labs: Lab Results  Component Value Date   WBC 11.8* 08/04/2014   HGB 12.3 08/04/2014   HCT 36.4 08/04/2014   MCV 86.9 08/04/2014   PLT 159 08/04/2014    Assessment / Plan: Protracted active phase  Labor: Progressing slowly. Suspect inadequate contractions.  Preeclampsia:  NA Fetal Wellbeing:  Category II Pain Control:  Epidural I/D:  n/a Anticipated MOD:  May need C/S for FTP, chorio, fetal tachycardia Will give Tylenol, fluid bolus for maternal fever, fetal tachycardia. Discussed that if IUPC shows adequate contractions this may indicate that baby is not fitting. If inadequate contractions, consider pit augmentation if fetal status permits. Pt would like to try nipple stim first. Discussed protocol.  Informed pt that Haley Gibson will be present for delivery due to the concern for shoulder dystocia.  Haley Gibson.   Haley Gibson 08/05/2014, 10:13 AM

## 2014-08-05 NOTE — Progress Notes (Signed)
Patient ID: Haley Gibson, female   DOB: 1985/12/20, 28 y.o.   MRN: [id number] Patient pushed for >1.5 hr and no descent form +1 station, c/w arrest of descent. I recommend repeat cesarean section and the patient and her husband consent. The risk of anesthesia, infection bleeding pain, visceral organ damage were discussed and questions were answered.   Adam Phenix, MD 08/05/2014 5:11 PM

## 2014-08-05 NOTE — Telephone Encounter (Signed)
Error

## 2014-08-05 NOTE — Op Note (Signed)
Cesarean Section Procedure Note   [name]  08/04/2014 - 08/05/2014  Indications: Dystocia and second stage arrest   Pre-operative Diagnosis: Repeat Cesarean Section for arrest of descent, chorioamnionitis  Post-operative Diagnosis: Same   Surgeon: Surgeon(s) and Role:    * Adam Phenix, MD - Primary      * Allie Bossier, MD - Assisting   Assistants: Dr. Nicholaus Bloom  Anesthesia: epidural   Procedure Details:  The patient was seen in the Holding Room. The risks, benefits, complications, treatment options, and expected outcomes were discussed with the patient. The patient concurred with the proposed plan, giving informed consent. identified as Haley Gibson and the procedure verified as C-Section Delivery. A Time Out was held and the above information confirmed.  After induction of anesthesia, the patient was draped and prepped in the usual sterile manner. A transverse was made and carried down through the subcutaneous tissue to the fascia. Fascial incision was made and extended transversely. The fascia was separated from the underlying rectus tissue superiorly and inferiorly. The peritoneum was identified and entered. Peritoneal incision was extended longitudinally. The utero-vesical peritoneal reflection was incised transversely and the bladder flap was bluntly freed from the lower uterine segment. A low transverse uterine incision was made. Delivered from cephalic presentation was aLiving newborn infant(s) or Female with Apgar scores of 9 at one minute and 9 at five minutes. Cord ph was not sent the umbilical cord was clamped and cut cord blood was obtained for evaluation. The placenta was removed Intact and appeared normal. The uterine outline, tubes and ovaries appeared normal. The uterine incision was closed with running locked sutures of 0Vicryl.  An imbricating layer followed Hemostasis was observed.. Peritoneum was closed with 2-0 Vicryl. The fascia was then reapproximated with  running sutures of 0Vicryl. The subcutaneous closure was performed using 2-0plain gut. The skin was closed with 4-0Vicryl. Honeycomb dressing applied  Instrument, sponge, and needle counts were correct prior the abdominal closure and were correct at the conclusion of the case.    Findings:   Estimated Blood Loss: 600 ml  Total IV Fluids:   Urine Output: 100CC OF clear urine  Specimens: placenta  Complications: no complications  Disposition: PACU - hemodynamically stable.   Maternal Condition: stable   Baby condition / location:  Couplet care / Skin to Skin  Attending Attestation: I was present and scrubbed for the entire procedure.   Signed: Surgeon(s): Adam Phenix, MD Perry Mount, MD Allie Bossier, MD

## 2014-08-05 NOTE — Progress Notes (Signed)
Haley Gibson is a 28 y.o. G3P1011 at [redacted]w[redacted]d   Subjective: Got some relief from Fentanyl, but now wants epidural  Objective: BP 139/96  Pulse 103  Temp(Src) 97.1 F (36.2 C) (Axillary)  Resp 20  Ht  (1.6 m)  Wt 92.08 kg (203 lb)  BMI 35.97 kg/m2  LMP 10/15/2013      FHT:  FHR: 140s bpm, variability: moderate,  accelerations:  Present,  decelerations:  Absent UC:   regular, every 3-4 minutes SVE:   Dilation: 6.5 Effacement (%): 90 Station: -2 Exam by:: Pincus Badder  Labs: Lab Results  Component Value Date   WBC 11.8* 08/04/2014   HGB 12.3 08/04/2014   HCT 36.4 08/04/2014   MCV 86.9 08/04/2014   PLT 159 08/04/2014    Assessment / Plan: Active labor VBAC Anticipating LGA infant  Will place epidural  Aryella Besecker CNM 08/05/2014, 12:38 AM

## 2014-08-05 NOTE — Progress Notes (Signed)
Patient ID: Haley Gibson, female   DOB: [birth year], 28 y.o.   MRN: [id number] Variable lasting 100 sec w/ good return to baseline 170's. Will CTO closely. Dr. Debroah Loop reviewed strip.   [place], CNM 08/05/2014 11:21 AM

## 2014-08-05 NOTE — Anesthesia Procedure Notes (Signed)
Epidural Patient location during procedure: OB  Staffing Anesthesiologist: London Nonaka R Performed by: anesthesiologist   Preanesthetic Checklist Completed: patient identified, pre-op evaluation, timeout performed, IV checked, risks and benefits discussed and monitors and equipment checked  Epidural Patient position: sitting Prep: site prepped and draped and DuraPrep Patient monitoring: heart rate Approach: midline Location: L2-L3 Injection technique: LOR air and LOR saline  Needle:  Needle type: Tuohy  Needle gauge: 17 G Needle length: 9 cm Needle insertion depth: 6 cm Catheter type: closed end flexible Catheter size: 19 Gauge Catheter at skin depth: 12 cm Test dose: negative  Assessment Sensory level: T8 Events: blood not aspirated, injection not painful, no injection resistance, negative IV test and no paresthesia  Additional Notes Reason for block:procedure for pain   

## 2014-08-05 NOTE — Consult Note (Signed)
Neonatology Note:  Attendance at C-section:  I was asked by Dr. Arnold to attend this repeat C/S at 41 1/7 weeks due to FTP. The mother is a G3P1A1 O pos, GBS neg with fever during labor to 100.8. ROM 32 hours prior to delivery, fluid clear. Mother received Ampicillin and Gentamicin > 4 hours prior to delivery. Infant vigorous with good spontaneous cry and tone. Needed only bulb suctioning. Infant is large for dates and appears healthy. Ap 9/9. Lungs clear to ausc in DR. To CN to care of Pediatrician.  Pallie Swigert C. Genaro Bekker, MD  

## 2014-08-05 NOTE — Transfer of Care (Signed)
Immediate Anesthesia Transfer of Care Note  Patient: Haley Gibson  Procedure(s) Performed: Procedure(s): CESAREAN SECTION (N/A)  Patient Location: PACU  Anesthesia Type:Epidural  Level of Consciousness: awake, alert , oriented and patient cooperative  Airway & Oxygen Therapy: Patient Spontanous Breathing  Post-op Assessment: Report given to PACU RN, Post -op Vital signs reviewed and stable and Patient moving all extremities X 4  Post vital signs: Reviewed and stable  Complications: No apparent anesthesia complications

## 2014-08-05 NOTE — Anesthesia Postprocedure Evaluation (Signed)
  Anesthesia Post Note  Patient: Haley Gibson  Procedure(s) Performed: Procedure(s) (LRB): CESAREAN SECTION (N/A)  Anesthesia type: Epidural  Patient location: PACU  Post pain: Pain level controlled  Post assessment: Post-op Vital signs reviewed  Last Vitals:  Filed Vitals:   08/05/14 1845  BP: 94/51  Pulse: 113  Temp:   Resp: 27    Post vital signs: Reviewed  Level of consciousness: awake  Complications: No apparent anesthesia complications

## 2014-08-05 NOTE — Progress Notes (Signed)
Patient ID: Haley Gibson, female   DOB: 1986/01/02, 28 y.o.   MRN: [id number] Pt pushed one hour w/ no decent despite excellent maternal pushing and multiple position changes. CNM voiced great concern of combination over no second stage descent, chorio, persistent fetal tachycardia in 170-190's. Strongly recommends C/S. Dr. Debroah Loop called to [facility] to assess pt's pushing efforts, pelvis. Strongly recommends C/S. Pt requested a few few more pushes, tearful. Family agrees that pt needs C/S and is urging pt to consent. Pt gave verbal and written consent. Prep OR. Pt voiced gratitude for being given every opportunity to achieve a vaginal birth.

## 2014-08-05 NOTE — Progress Notes (Signed)
Haley Gibson is a 28 y.o. G3P1011 at [redacted]w[redacted]d   Subjective: Comfortable with epidural; bothered by shaking; fever  Objective: BP 112/72  Pulse 126  Temp(Src) 100.6 F (38.1 C) (Axillary)  Resp 20  Ht  (1.6 m)  Wt 92.08 kg (203 lb)  BMI 35.97 kg/m2  SpO2 99%  LMP 10/15/2013      FHT:  FHR: 160s bpm, variability: moderate,  accelerations:  Present,  decelerations:  Absent- occ variables UC:   regular, every 3-4 minutes SVE:  8/100/-1  Labs: Lab Results  Component Value Date   WBC 11.8* 08/04/2014   HGB 12.3 08/04/2014   HCT 36.4 08/04/2014   MCV 86.9 08/04/2014   PLT 159 08/04/2014    Assessment / Plan: Active labor/transition Fever  Will begin Amp/Gent and give Tylenol and fluiMARIABELLA Gibson the implications for fever and FHR  SHAW, KIMBERLY CNM 08/05/2014, 4:19 AM

## 2014-08-06 ENCOUNTER — Encounter (HOSPITAL_COMMUNITY): Payer: Self-pay | Admitting: Obstetrics & Gynecology

## 2014-08-06 ENCOUNTER — Inpatient Hospital Stay (HOSPITAL_COMMUNITY): Admission: RE | Admit: 2014-08-06 | Source: Ambulatory Visit

## 2014-08-06 LAB — CBC
HEMATOCRIT: 30.8 % — AB (ref 36.0–46.0)
Hemoglobin: 10.3 g/dL — ABNORMAL LOW (ref 12.0–15.0)
MCH: 29.3 pg (ref 26.0–34.0)
MCHC: 33.4 g/dL (ref 30.0–36.0)
MCV: 87.7 fL (ref 78.0–100.0)
Platelets: 130 10*3/uL — ABNORMAL LOW (ref 150–400)
RBC: 3.51 MIL/uL — AB (ref 3.87–5.11)
RDW: 15.4 % (ref 11.5–15.5)
WBC: 18.5 10*3/uL — AB (ref 4.0–10.5)

## 2014-08-06 NOTE — Anesthesia Postprocedure Evaluation (Signed)
  Anesthesia Post-op Note  Patient: Haley Gibson  Procedure(s) Performed: Procedure(s): CESAREAN SECTION (N/A)  Patient Location: PACU and Mother/Baby  Anesthesia Type:Epidural  Level of Consciousness: awake, alert  and oriented  Airway and Oxygen Therapy: Patient Spontanous Breathing  Post-op Pain: mild  Post-op Assessment: Post-op Vital signs reviewed  Post-op Vital Signs: Reviewed and stable  Last Vitals:  Filed Vitals:   08/06/14 1330  BP: 111/64  Pulse: 94  Temp: 36.7 C  Resp: 16    Complications: No apparent anesthesia complications

## 2014-08-06 NOTE — Addendum Note (Signed)
Addendum created 08/06/14 1721 by Armanda Heritage, CRNA   Modules edited: Notes Section   Notes Section:  File: [id number]

## 2014-08-06 NOTE — Lactation Note (Signed)
This note was copied from the chart of Haley Cloyce Paterson. Lactation Consultation Note  Patient Name: Haley Gibson [id number] Date: 08/06/2014 Reason for consult: Initial assessment Baby 22 hours of life. Mom reports that baby has had some difficulty latching. Mom reports that she has just been letting the baby try to find the nipple and latch self. Mom's bread are pendulous and wide-spaced with small about of breast tissue in relation to nipples. Mom reports that she had no issues with supple with first baby, nursing for 2.5 years. But did have difficulty with getting baby to latch at the start and had to supplement and finger-feed. Mom latched baby in cradle position, but baby had a shallow latch on tip of nipple. Mom states that she knows she will be sore if he doesn't latch more deeply. Assisted mom to latch baby more deeply in football hold to right breast. Baby latched deeply, suckling rhythmically for 10 minutes with intermittent swallows noted. Baby pulled off breast and mom was enc to latch again. Baby fussy, had to be enc to suckle using gloved finger of LC. Baby latched and suckled another 10 minutes. Enc mom to offer lots of STS, nurse with cues and at least 8-12 times/24 hours. Enc mom to consider pumping if baby refuses the breast. Mom brought her own DEBP and discussed that hospital has DEBP as well. Enc mom to ask for assistance with latch as needed. Mom given [facility]  Brochure, aware of OP/BFSG and community resources.  Maternal Data Has patient been taught Hand Expression?: Yes Does the patient have breastfeeding experience prior to this delivery?: Yes  Feeding Feeding Type: Breast Fed Length of feed: 20 min  LATCH Score/Interventions Latch: Repeated attempts needed to sustain latch, nipple held in mouth throughout feeding, stimulation needed to elicit sucking reflex. Intervention(s): Adjust position;Assist with latch;Breast massage;Breast compression  Audible Swallowing:  Spontaneous and intermittent  Type of Nipple: Everted at rest and after stimulation  Comfort (Breast/Nipple): Soft / non-tender     Hold (Positioning): Assistance needed to correctly position infant at breast and maintain latch. Intervention(s): Breastfeeding basics reviewed;Support Pillows;Position options  LATCH Score: 8  Lactation Tools Discussed/Used     Consult Status Consult Status: Follow-up Date: 08/07/14 Follow-up type: In-patient    Geralynn Ochs 08/06/2014, 4:33 PM

## 2014-08-06 NOTE — Progress Notes (Signed)
Post Partum Day 1 rLTCS  Subjective: no complaints, up ad lib, tolerating PO and + flatus Foley still in.  Objective: Blood pressure 102/65, pulse 102, temperature 98 F (36.7 C), temperature source Oral, resp. rate 16, height  (1.6 m), weight 92.08 kg (203 lb), last menstrual period 10/15/2013, SpO2 96.00%, unknown if currently breastfeeding.  Physical Exam:  General: alert, cooperative and no distress Lochia: appropriate Uterine Fundus: firm Incision: healing well, minimal serosanguinous fluid on honeycomb dressing DVT Evaluation: No evidence of DVT seen on physical exam. Negative Homan's sign. No cords or calf tenderness.   Recent Labs  08/04/14 1548  HGB 12.3  HCT 36.4    Assessment/Plan: Plan for discharge tomorrow, Breastfeeding and Contraception natural family planning and condoms when necessary Circumcision as an outpatient   LOS: 2 days   Haley Gibson 08/06/2014, 5:55 AM   I have seen and examined this patient and agree the above assessment. CRESENZO-DISHMAN,Akeira Lahm 08/09/2014 1:31 PM

## 2014-08-07 MED ORDER — IBUPROFEN 600 MG PO TABS: 600.0000 mg | ORAL_TABLET | Freq: Four times a day (QID) | ORAL | Status: DC

## 2014-08-07 MED ORDER — OXYCODONE-ACETAMINOPHEN 5-325 MG PO TABS: 1.0000 | ORAL_TABLET | ORAL | Status: DC | PRN

## 2014-08-07 MED ORDER — SENNOSIDES-DOCUSATE SODIUM 8.6-50 MG PO TABS: 2.0000 | ORAL_TABLET | ORAL | Status: DC

## 2014-08-07 NOTE — Progress Notes (Signed)
Post Partum Day 2 from rLTCS  Subjective: Patient very tearful. She states she got confused this AM because the baby was in the craddle by himself. She continues to cry throughout the interview and exam intermittently Pain is well tolerated with Ibuprofen. She does not want to take percocet. She is unsure of whether she should go home today but feels she could make an appropriate decision after showering and "being more herself." Tolerating PO. Voiding well. +flatus. No BM yet.   Objective: Blood pressure 111/81, pulse 99, temperature 97.4 F (36.3 C), temperature source Oral, resp. rate 16, height  (1.6 m), weight 92.08 kg (203 lb), last menstrual period 10/15/2013, SpO2 98.00%, unknown if currently breastfeeding.  Physical Exam:  General: mild distress, crying when I walk in the room. Lochia: appropriate Uterine Fundus: firm Incision: no significant drainage DVT Evaluation: No evidence of DVT seen on physical exam. Negative Homan's sign. No cords or calf tenderness.   Recent Labs  08/04/14 1548 08/06/14 0625  HGB 12.3 10.3*  HCT 36.4 30.8*    Assessment/Plan: Either D/c today or tomorrow. Natural family planning with condoms if necessary.  Breastfeeding.  Circumcision as an outpatient    LOS: 3 days   Joanna Puff 08/07/2014, 5:53 AM   Agree with assessment and plan, please see discharge summary for further details Perry Mount, MD

## 2014-08-07 NOTE — Progress Notes (Signed)
Clinical Social Work Department PSYCHOSOCIAL ASSESSMENT - MATERNAL/CHILD 08/07/2014  Patient:  Haley Gibson,Haley Gibson  Account Number:  [id number]  Admit Date:  08/04/2014  Childs Name:   Samuel Grantham    Clinical Social Worker:  Garey Alleva, LCSW   Date/Time:  08/07/2014 01:30 PM  Date Referred:  08/07/2014   Referral source  Central Nursery     Referred reason  Behavioral Health Issues   Other referral source:    I:  FAMILY / HOME ENVIRONMENT Child's legal guardian:  PARENT  Guardian - Name Guardian - Age Guardian - Address  Barnaby,Bernetta Gibson 28 [address]  [place], [place] [address]  Sadik, Jurline 28 same as above   Other household support members/support persons Other support:    II  PSYCHOSOCIAL DATA Information Source:    Financial and Community Resources Employment:   Spouse is s disabled Vet   Financial resources:  Private Insurance If Medicaid - County:    School / Grade:   Maternity Care Coordinator / Child Services Coordination / Early Interventions:  Cultural issues impacting care:    III  STRENGTHS Strengths  Supportive family/friends  Home prepared for Child (including basic supplies)  Adequate Resources   Strength comment:    IV  RISK FACTORS AND CURRENT PROBLEMS Current Problem:       V  SOCIAL WORK ASSESSMENT Acknowledged order for Social Work consult to assess mother's history of depression.  Parents are married and have one other dependent age 5.  Mother states that she has a history of PP Depression with previous pregnancy.  She notes that she received [facility] once she was diagnosed with PP Depression.  Mother states that she was never prescribed medication for the depression. She denies any current symptoms of depression or anxiety. Mother reports adequate family support.   She denies any hx of substance abuse.  No acute social concerns related at this time.    Mother informed of social work availability       VI SOCIAL WORK PLAN Social Work Plan  No Further Intervention Required / No Barriers to Discharge    

## 2014-08-07 NOTE — Discharge Summary (Signed)
Physician Obstetric Discharge Summary  Patient ID: Haley Gibson MRN: [id number] DOB/AGE: 02-28-1986 28 y.o.  Reason for Admission: onset of labor Prenatal Procedures: none Intrapartum Procedures: cesarean: low cervical, transverse Postpartum Procedures: none Complications-Operative and Postpartum: none  Delivery Note Indications: Dystocia and second stage arrest  Pre-operative Diagnosis: Repeat Cesarean Section for arrest of descent, chorioamnionitis  Post-operative Diagnosis: Same  Surgeon: Surgeon(s) and Role:  * Adam Phenix, MD - Primary  * Allie Bossier, MD - Assisting  Assistants: Dr. Nicholaus Bloom  Anesthesia: epidural  Procedure Details:  The patient was seen in the Holding Room. The risks, benefits, complications, treatment options, and expected outcomes were discussed with the patient. The patient concurred with the proposed plan, giving informed consent. identified as Haley Gibson and the procedure verified as C-Section Delivery. A Time Out was held and the above information confirmed.  After induction of anesthesia, the patient was draped and prepped in the usual sterile manner. A transverse was made and carried down through the subcutaneous tissue to the fascia. Fascial incision was made and extended transversely. The fascia was separated from the underlying rectus tissue superiorly and inferiorly. The peritoneum was identified and entered. Peritoneal incision was extended longitudinally. The utero-vesical peritoneal reflection was incised transversely and the bladder flap was bluntly freed from the lower uterine segment. A low transverse uterine incision was made. Delivered from cephalic presentation was aLiving newborn infant(s) or Female with Apgar scores of 9 at one minute and 9 at five minutes. Cord ph was not sent the umbilical cord was clamped and cut cord blood was obtained for evaluation. The placenta was removed Intact and appeared normal. The uterine outline,  tubes and ovaries appeared normal. The uterine incision was closed with running locked sutures of 0Vicryl. An imbricating layer followed Hemostasis was observed.. Peritoneum was closed with 2-0 Vicryl. The fascia was then reapproximated with running sutures of 0Vicryl. The subcutaneous closure was performed using 2-0plain gut. The skin was closed with 4-0Vicryl. Honeycomb dressing applied  Instrument, sponge, and needle counts were correct prior the abdominal closure and were correct at the conclusion of the case.   Findings:  Estimated Blood Loss: 600 ml  Total IV Fluids:  Urine Output: 100CC OF clear urine  Specimens: placenta  Complications: no complications  Disposition: PACU - hemodynamically stable.  Maternal Condition: stable  Baby condition / location: Couplet care / Skin to Skin  H/H:  Lab Results  Component Value Date/Time   HGB 10.3* 08/06/2014  6:25 AM   HCT 30.8* 08/06/2014  6:25 AM    Brief Hospital Course: Haley Gibson is a [id number] who underwent cesarean section on 08/04/2014 - 08/05/2014 due to no second stage descent, chorioamionitis, and persistent fetal tachycardia in 170-190's.   Patient had an uncomplicated surgery; for further details of this surgery, please refer to the operative note copied above.  Patient had an uncomplicated postpartum course.  By time of discharge on POD/PPD#2, her pain was controlled on oral pain medications; she had appropriate lochia and was ambulating, voiding without difficulty, tolerating regular diet and passing flatus.   She was deemed stable for discharge to home.    Physical Exam:  General: alert and cooperative Lochia: appropriate Uterine Fundus: firm Incision: no significant drainage DVT Evaluation: No evidence of DVT seen on physical exam. Negative Homan's sign. No cords or calf tenderness.  Discharge Diagnoses: Term Pregnancy-delivered  Discharge Information: Date: 08/07/2014 Activity: pelvic rest Diet: routine Baby  feeding: plans to breastfeed  Contraception: natural family planning (NFP) Medications: PNV, Ibuprofen, Colace and Percocet Discharged Condition: good Instructions: refer to practice specific booklet Discharge to: home  Signed: Rodrigo Ran, M.D. FM PGY-1  08/07/2014, 5:58 AM  I have seen patient and agree with resident's exam and assessment.  Unsure if she is ready for discharge at this time, was looking forward to going home today.  Pain well controlled with motrin.  Family planning for birth control, continues to breast feed.  Perry Mount, MD

## 2014-08-07 NOTE — Discharge Instructions (Signed)

## 2014-08-10 ENCOUNTER — Telehealth: Payer: Self-pay | Admitting: Cardiology

## 2014-08-10 NOTE — Telephone Encounter (Signed)
CD Of Records received, Sent Interoffice to [employer] M/ Dr.Crenshaw @ [facility]  9.8.15/km

## 2014-09-20 ENCOUNTER — Encounter: Payer: Self-pay | Admitting: Advanced Practice Midwife

## 2014-09-20 ENCOUNTER — Ambulatory Visit (INDEPENDENT_AMBULATORY_CARE_PROVIDER_SITE_OTHER): Admitting: Advanced Practice Midwife

## 2014-09-20 NOTE — Progress Notes (Signed)
  Subjective:     Haley Gibson is a 28 y.o. female who presents for a postpartum visit. She is 6 weeks postpartum following a low cervical transverse Cesarean section. I have fully reviewed the prenatal and intrapartum course. The delivery was at 41 gestational weeks. Outcome: repeat cesarean section, low transverse incision. Anesthesia: epidural. 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 Resolved. Baby's course has been Uncomplicated. Baby is feeding by breast. Bleeding light bleeding x 4 days. Lochia ceased 3 weeks PP.Marland Kitchen. Bowel function is normal. Bladder function is normal. Patient is sexually active. Contraception method is condoms. Postpartum depression screening: negative.  The following portions of the patient's history were reviewed and updated as appropriate: allergies, current medications, past family history, past medical history, past social history, past surgical history and problem list.  Review of Systems Pertinent items are noted in HPI.   Objective:    BP 117/83  Pulse 94  Resp 16  Ht 5\' 3"  (1.6 m)  Wt 169 lb (76.658 kg)  BMI 29.94 kg/m2  LMP 09/16/2014  Breastfeeding? Yes  General:  alert, cooperative, appears stated age and no distress   Breasts:  Declined exam  Lungs: clear to auscultation bilaterally  Heart:  regular rate and rhythm, S1, S2 normal, no murmur, click, rub or gallop  Abdomen: soft, non-tender; bowel sounds normal; no masses,  no organomegaly and Low transverse incision well-healed, NT   Vulva:  not evaluated  Vagina: not evaluated                    Assessment:  Normal PP exam.  Plan:    1. Contraception: condoms 2. Encouraged to allow 2 years bwt births 3. Follow up in: 1 year or as needed.   [place] [name], CNM 09/20/2014 12:27 PM

## 2014-09-20 NOTE — Patient Instructions (Signed)
Mom and baby [facility] Mindi Slicker  Breastfeeding Challenges and Solutions Even though breastfeeding is natural, it can be challenging, especially in the first few weeks after childbirth. It is normal for problems to arise when starting to breastfeed your new baby, even if you have breastfed before. This document provides some solutions to the most common breastfeeding challenges.  CHALLENGES AND SOLUTIONS Challenge--Cracked or Sore Nipples Cracked or sore nipples are commonly experienced by breastfeeding mothers. Cracked or sore nipples often are caused by inadequate latching (when your baby's mouth attaches to your breast to breastfeed). Soreness can also happen if your baby is not positioned properly at your breast. Although nipple cracking and soreness are common during the first week after birth, nipple pain is never normal. If you experience nipple cracking or soreness that lasts longer than 1 week or nipple pain, call your health care provider or lactation consultant.  Solution Ensure proper latching and positioning of your baby by following the steps below:  Find a comfortable place to sit or lie down, with your neck and back well supported.  Place a pillow or rolled up blanket under your baby to bring him or her to the level of your breast (if you are seated).  Make sure that your baby's abdomen is facing your abdomen.  Gently massage your breast. With your fingertips, massage from your chest wall toward your nipple in a circular motion. This encourages milk flow. You may need to continue this action during the feeding if your milk flows slowly.  Support your breast with 4 fingers underneath and your thumb above your nipple. Make sure your fingers are well away from your nipple and your baby's mouth.  Stroke your baby's lips gently with your finger or nipple.  When your baby's mouth is open wide enough, quickly bring your baby to your breast, placing your entire nipple and as much of the  colored area around your nipple (areola) as possible into your baby's mouth.  More areola should be visible above your baby's upper lip than below the lower lip.  Your baby's tongue should be between his or her lower gum and your breast.  Ensure that your baby's mouth is correctly positioned around your nipple (latched). Your baby's lips should create a seal on your breast and be turned out (everted).  It is common for your baby to suck for about 2-3 minutes in order to start the flow of breast milk. Signs that your baby has successfully latched on to your nipple include:   Quietly tugging or quietly sucking without causing you pain.   Swallowing heard between every 3-4 sucks.   Muscle movement above and in front of his or her ears with sucking.  Signs that your baby has not successfully latched on to nipple include:   Sucking sounds or smacking sounds from your baby while nursing.   Nipple pain.  Ensure that your breasts stay moisturized and healthy by:  Avoiding the use of soap on your nipples.   Wearing a supportive bra. Avoid wearing underwire-style bras or tight bras.   Air drying your nipples for 3-4 minutes after each feeding.   Using only cotton bra pads to absorb breast milk leakage. Leaking of breast milk between feedings is normal. Be sure to change the pads if they become soaked with milk.  Using lanolin on your nipples after nursing. Lanolin helps to maintain your skin's normal moisture barrier. If you use pure lanolin you do not need to wash it off  before feeding your baby again. Pure lanolin is not toxic to your baby. You may also hand express a few drops of breast milk and gently massage that milk into your nipples, allowing it to air dry. Challenge--Breast Engorgement Breast engorgement is the overfilling of your breasts with breast milk. In the first few weeks after giving birth, you may experience breast engorgement. Breast engorgement can make your  breasts throb and feel hard, tightly stretched, warm, and tender. Engorgement peaks about the fifth day after you give birth. Having breast engorgement does not mean you have to stop breastfeeding your baby. Solution  Breastfeed when you feel the need to reduce the fullness of your breasts or when your baby shows signs of hunger. This is called "breastfeeding on demand."  Newborns (babies younger than 4 weeks) often breastfeed every 1-3 hours during the day. You may need to awaken your baby to feed if he or she is asleep at a feeding time.  Do not allow your baby to sleep longer than 5 hours during the night without a feeding.  Pump or hand express breast milk before breastfeeding to soften your breast, areola, and nipple.  Apply warm, moist heat (in the shower or with warm water-soaked hand towels) just before feeding or pumping, or massage your breast before or during breastfeeding. This increases circulation and helps your milk to flow.  Completely empty your breasts when breastfeeding or pumping. Afterward, wear a snug bra (nursing or regular) or tank top for 1-2 days to signal your body to slightly decrease milk production. Only wear snug bras or tank tops to treat engorgement. Tight bras typically should be avoided by breastfeeding mothers. Once engorgement is relieved, return to wearing regular, loose-fitting clothes.  Apply ice packs to your breasts to lessen the pain from engorgement and relieve swelling, unless the ice is uncomfortable for you.  Do not delay feedings. Try to relax when it is time to feed your baby. This helps to trigger your "let-down reflex," which releases milk from your breast.  Ensure your baby is latched on to your breast and positioned properly while breastfeeding.  Allow your baby to remain at your breast as long as he or she is latched on well and actively sucking. Your baby will let you know when he or she is done breastfeeding by pulling away from your breast  or falling asleep.  Avoid introducing bottles or pacifiers to your baby in the early weeks of breastfeeding. Wait to introduce these things until after resolving any breastfeeding challenges.  Try to pump your milk on the same schedule as when your baby would breastfeed if you are returning to work or away from home for an extended period.  Drink plenty of fluids to avoid dehydration, which can eventually put you at greater risk of breast engorgement. If you follow these suggestions, your engorgement should improve in 24-48 hours. If you are still experiencing difficulty, call your lactation consultant or health care provider.  Challenge--Plugged Milk Ducts Plugged milk ducts occur when the duct does not drain milk effectively and becomes swollen. Wearing a tight-fitting nursing bra or having difficulty with latching may cause plugged milk ducts. Not drinking enough water (8-10 c [1.9-2.4 L] per day) can contribute to plugged milk ducts. Once a duct has become plugged, hard lumps, soreness, and redness may develop in your breast.  Solution Do not delay feedings. Feed your baby frequently and try to empty your breasts of milk at each feeding. Try breastfeeding from the  affected side first so there is a better chance that the milk will drain completely from that breast. Apply warm, moist towels to your breasts for 5-10 minutes before feeding. Alternatively, a hot shower right before breastfeeding can provide the moist heat that can encourage milk flow. Gentle massage of the sore area before and during a feeding may also help. Avoid wearing tight clothing or bras that put pressure on your breasts. Wear bras that offer good support to your breasts, but avoid underwire bras. If you have a plugged milk duct and develop a fever, you need to see your health care provider.  Challenge--Mastitis Mastitis is inflammation of your breast. It usually is caused by a bacterial infection and can cause flu-like symptoms.  You may develop redness in your breast and a fever. Often when mastitis occurs, your breast becomes firm, warm, and very painful. The most common causes of mastitis are poor latching, ineffective sucking from your baby, consistent pressure on your breast (possibly from wearing a tight-fitting bra or shirt that restricts the milk flow), unusual stress or fatigue, or missed feedings.  Solution You will be given antibiotic medicine to treat the infection. It is still important to breastfeed frequently to empty your breasts. Continuing to breastfeed while you recover from mastitis will not harm your baby. Make sure your baby is positioned properly during every feeding. Apply moist heat to your breasts for a few minutes before feeding to help the milk flow and to help your breasts empty more easily. Challenge--Thrush Ginette Pitmanhrush is a yeast infection that can form on your nipples, in your breast, or in your baby's mouth. It causes itching, soreness, burning or stabbing pain, and sometimes a rash.  Solution You will be given a medicated ointment for your nipples, and your baby will be given a liquid medicine for his or her mouth. It is important that you and your baby are treated at the same time because thrush can be passed between you and your baby. Change disposable nursing pads often. Any bras, towels, or clothing that come in contact with infected areas of your body or your baby's body need to be washed in very hot water every day. Wash your hands and your baby's hands often. All pacifiers, bottle nipples, or toys your baby puts in his or her mouth should be boiled once a day for 20 minutes. After 1 week of treatment, discard pacifiers and bottle nipples and buy new ones. All breast pump parts that touch the milk need to be boiled for 20 minutes every day. Challenge--Low Milk Supply You may not be producing enough milk if your baby is not gaining the proper amount of weight. Breast milk production is based on a  supply-and-demand system. Your milk supply depends on how frequently and effectively your baby empties your breast. Solution The more you breastfeed and pump, the more breast milk you will produce. It is important that your baby empties at least one of your breasts at each feeding. If this is not happening, then use a breast pump or hand express any milk that remains. This will help to drain as much milk as possible at each feeding. It will also signal your body to produce more milk. If your baby is not emptying your breasts, it may be due to latching, sucking, or positioning problems. If low milk supply continues after addressing these issues, contact your health care provider or a lactation specialist as soon as possible. Challenge--Inverted or Flat Nipples Some women have nipples  that turn inward instead of protruding outward. Other women have nipples that are flat. Inverted or flat nipples can sometimes make it more difficult for your baby to latch onto your breast. Solution You may be given a small device that pulls out inverted nipples. This device should be applied right before your baby is brought to your breast. You can also try using a breast pump for a short time before placing the baby at your breast. The pump can pull your nipple outwards to help your infant latch more easily. The baby's sucking motion will help the inverted nipple protrude as well.  If you have flat nipples, encourage your baby to latch onto your breast and feed frequently in the early days after birth. This will give your baby practice latching on correctly while your breast is still soft. When your milk supply increases, between the second and fifth day after birth and your breasts become full, your baby will have an easier time latching.  Contact a lactation consultant if you still have concerns. She or he can teach you additional techniques to address breastfeeding problems related to nipple shape and position.  FOR MORE  INFORMATION [employer]: www.llli.org Document Released: 05/13/2006 Document Revised: 11/24/2013 Document Reviewed: 05/15/2013 [facility] Patient Information 2015 [place], [place]. This information is not intended to replace advice given to you by your health care provider. Make sure you discuss any questions you have with your health care provider.

## 2014-10-04 ENCOUNTER — Encounter: Payer: Self-pay | Admitting: Advanced Practice Midwife

## 2014-10-13 ENCOUNTER — Encounter: Payer: Self-pay | Admitting: Cardiology

## 2014-10-13 ENCOUNTER — Ambulatory Visit (INDEPENDENT_AMBULATORY_CARE_PROVIDER_SITE_OTHER): Admitting: Cardiology

## 2014-10-13 VITALS — BP 102/70 | HR 88 | Ht 63.0 in | Wt 167.0 lb

## 2014-10-13 DIAGNOSIS — R002 Palpitations: Secondary | ICD-10-CM

## 2014-10-13 DIAGNOSIS — R0600 Dyspnea, unspecified: Secondary | ICD-10-CM

## 2014-10-13 NOTE — Assessment & Plan Note (Signed)
Symptoms have resolved. LV function normal.

## 2014-10-13 NOTE — Progress Notes (Signed)
      HPI: FU palpitations. Patient states that she has had palpitations for approximately 10 years. In 2012 she was having palpitations and was seen in [place]. She had an echocardiogram that apparently was normal but records are not available. She wore a monitor that apparently showed ventricular tachycardia and was placed on verapamil. She continued this for approximately 2 months but didn't think it was helping and discontinued this. I initially saw her in April of 2015. Echocardiogram in May 2015 showed normal LV function and mild to moderate tricuspid regurgitation. Monitor showed sinus rhythm with episodes of sinus tachycardia and brief PAT. Since she was last seen, She has delivered and her new son is 282 months old. She denies dyspnea, chest pain or syncope or recurrent palpitations.  Current Outpatient Prescriptions  Medication Sig Dispense Refill  . Cholecalciferol (VITAMIN D PO) Take 5,000 Units by mouth daily.    . Omega-3 Fatty Acids (OMEGA 3 PO) Take 1 capsule by mouth daily.    . Prenatal Multivit-Min-Fe-FA (PRENATAL VITAMINS PO) Take 1 capsule by mouth daily.     . Probiotic Product (PROBIOTIC DAILY PO) Take 1 capsule by mouth daily.      No current facility-administered medications for this visit.     Past Medical History  Diagnosis Date  . Spinal headache   . Fatigue     chronic  . Anemia     post delivery  . Post partum depression   . Heart palpitations     Past Surgical History  Procedure Laterality Date  . Cesarean section    . Wisdom tooth extraction    . Cesarean section N/A 08/05/2014    Procedure: CESAREAN SECTION;  Surgeon: Adam PhenixJames G Arnold, MD;  Location: [facility];  Service: Obstetrics;  Laterality: N/A;    History   Social History  . Marital Status: Married    Spouse Name: N/A    Number of Children: 1  . Years of Education: N/A   Occupational History  . homemaker    Social History Main Topics  . Smoking status: Never Smoker   . Smokeless tobacco:  Never Used  . Alcohol Use: No  . Drug Use: No  . Sexual Activity:    Partners: Male    Birth Control/ Protection: Condom   Other Topics Concern  . Not on file   Social History Narrative    ROS: no fevers or chills, productive cough, hemoptysis, dysphasia, odynophagia, melena, hematochezia, dysuria, hematuria, rash, seizure activity, orthopnea, PND, pedal edema, claudication. Remaining systems are negative.  Physical Exam: Well-developed well-nourished in no acute distress.  Skin is warm and dry.  HEENT is normal.  Neck is supple.  Chest is clear to auscultation with normal expansion.  Cardiovascular exam is regular rate and rhythm.  Abdominal exam nontender or distended. No masses palpated. Extremities show no edema. neuro grossly intact  ECG Sinus rhythm at a rate of 88. No ST changes.

## 2014-10-13 NOTE — Assessment & Plan Note (Signed)
Patient's palpitations have resolved. Review of previous monitor showed brief PAT. Given that she is now asymptomatic, we will not treat at this point. We will consider beta blocker in the future if her symptoms worsen again. Note she was told in [place] in 2012 that she had ventricular tachycardia. Those records are not available. There was no evidence of ventricular tachycardia on most recent monitor. LV function is normal.

## 2014-10-13 NOTE — Patient Instructions (Signed)
Your physician recommends that you schedule a follow-up appointment in: AS NEEDED  

## 2015-08-03 IMAGING — US US OB DETAIL+14 WK
1 series · 15 of 28 positions shown · non-contrast
Comparison: none

[Series 1: us ob detail+14 wk · 0.18mm/px · 15 of 86 slices shown]
[im 1/86]
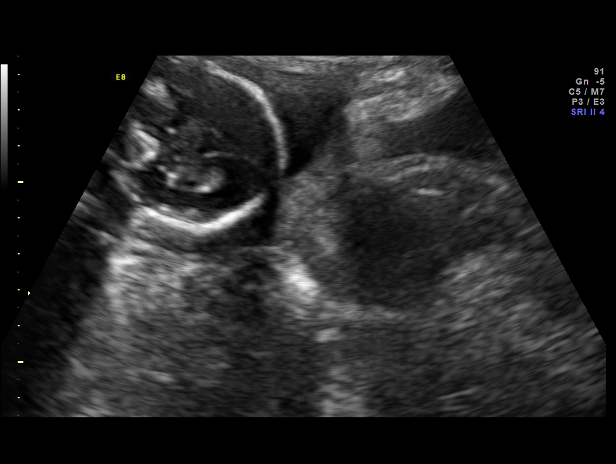
[im 7/86]
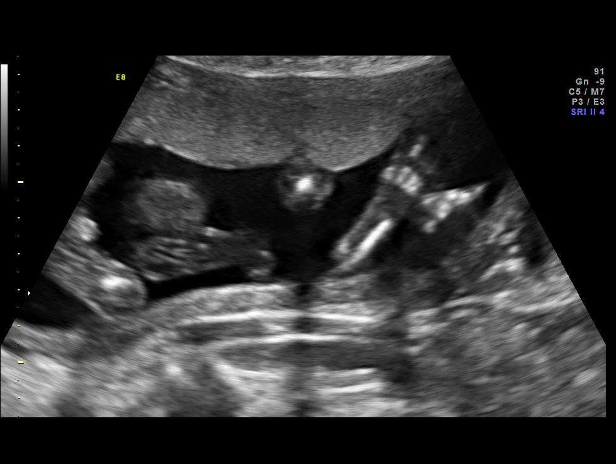
[im 13/86]
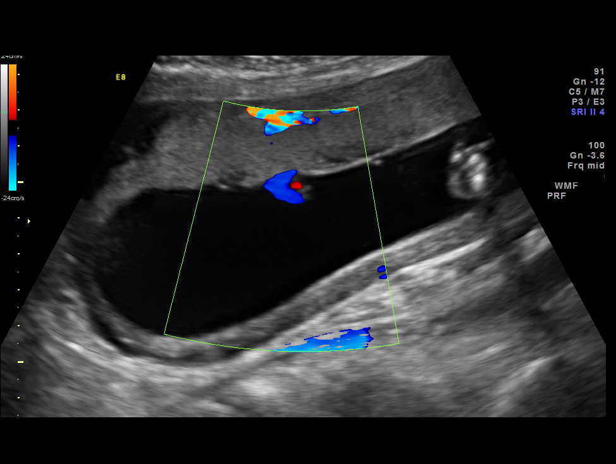
[im 19/86]
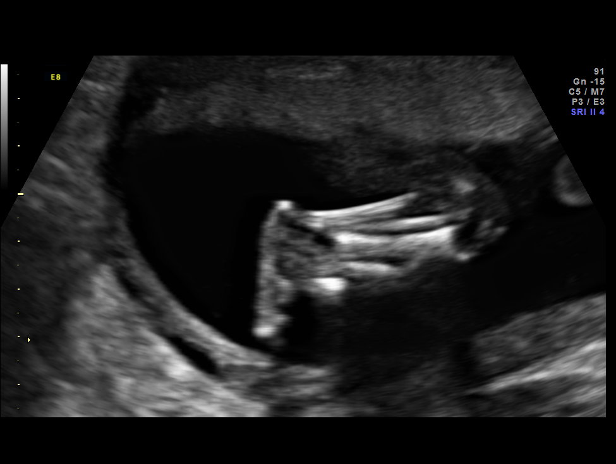
[im 26/86]
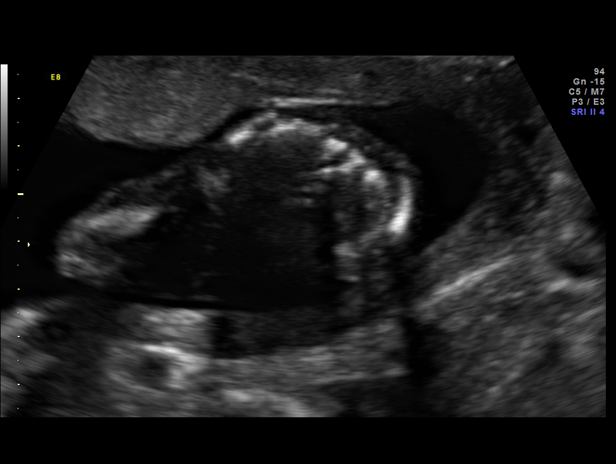
[im 32/86]
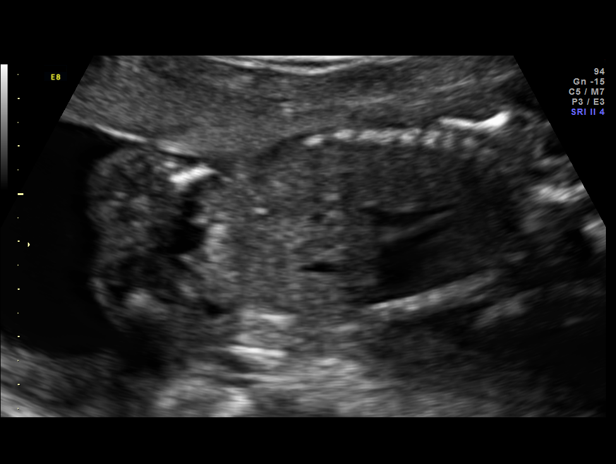
[im 38/86]
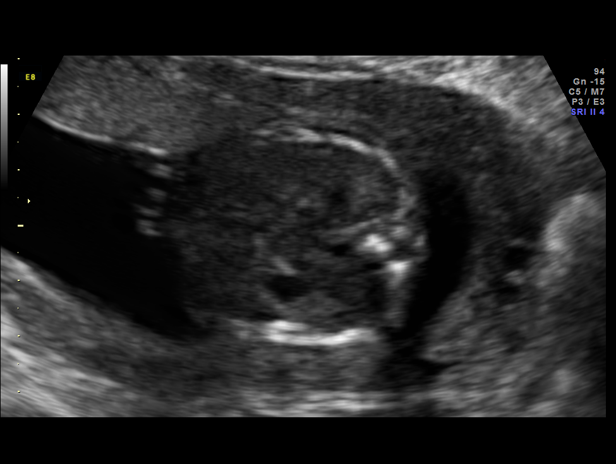
[im 45/86]
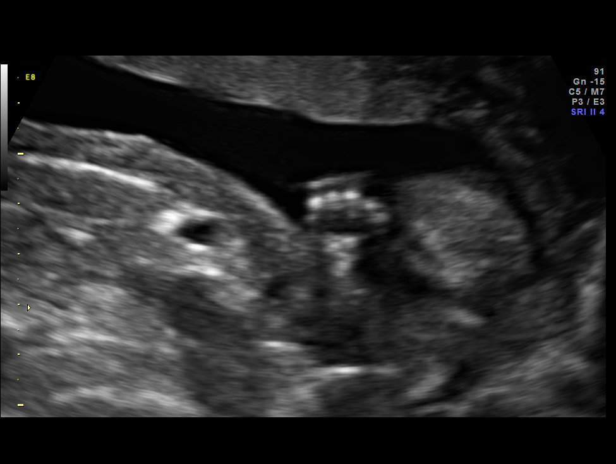
[im 48/86]
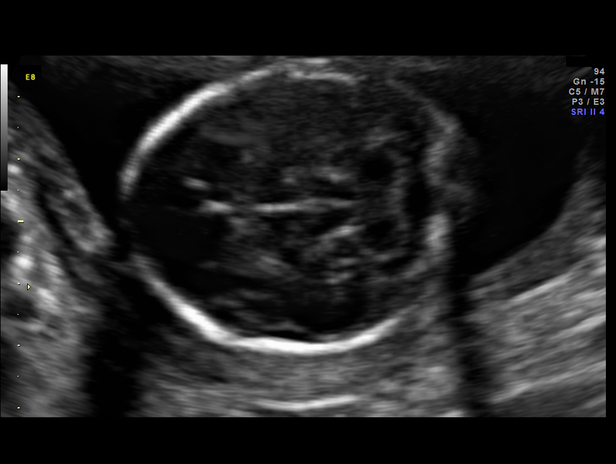
[im 54/86]
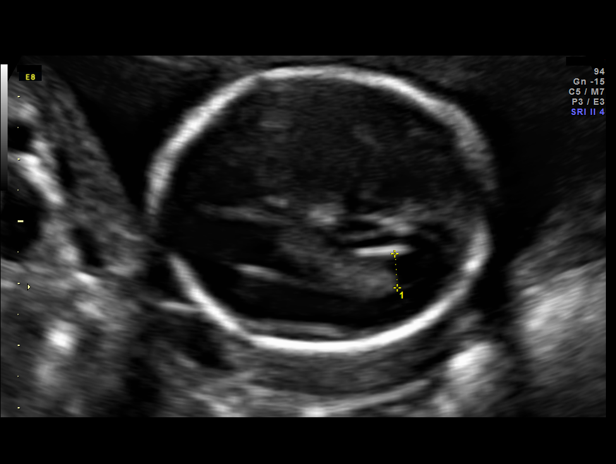
[im 60/86]
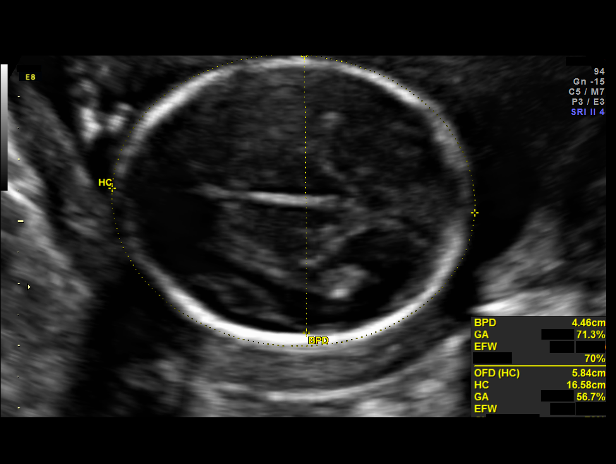
[im 67/86]
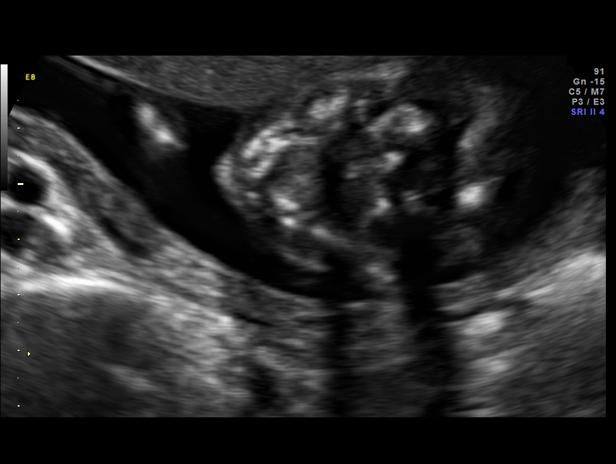
[im 73/86]
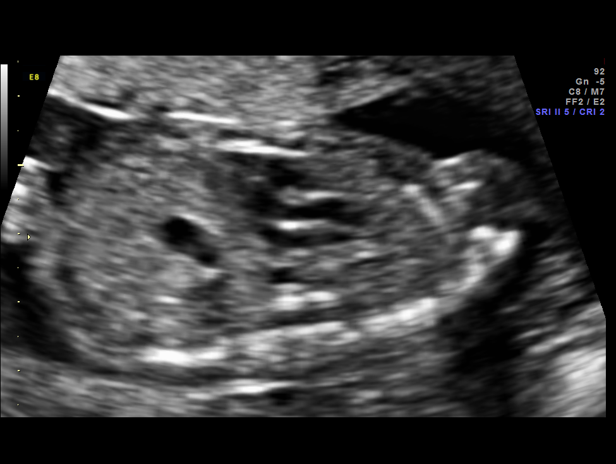
[im 79/86]
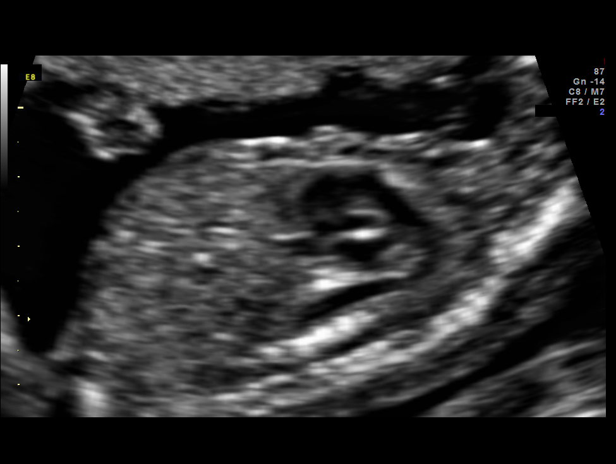
[im 86/86]
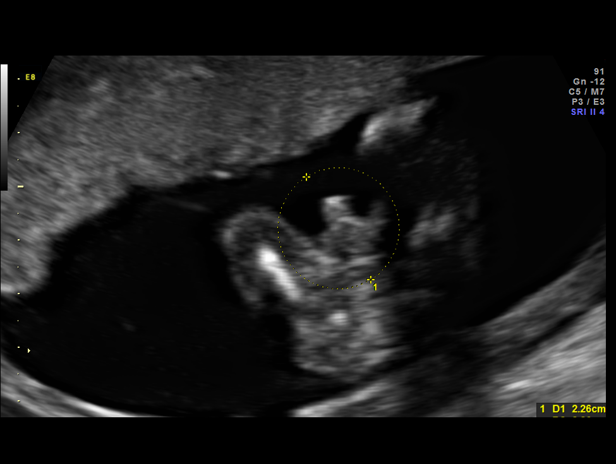

[15 of 28 positions shown; findings below may reference images not displayed]

OBSTETRICS REPORT
                    (Corrected Final 03/03/2014 [DATE])

Service(s) Provided

 US OB DETAIL + 14 WK                                  76811.0
Indications

 Detailed fetal anatomic survey
 Poor obstetric history: Previous macrosomia
 Previous cesarean section
 History of genetic / anatomic abnormality - family
 AV stenosis
Fetal Evaluation

 Num Of Fetuses:    1
 Fetal Heart Rate:  163                          bpm
 Cardiac Activity:  Observed
 Presentation:      Cephalic
 Placenta:          Anterior, above cervical os
 P. Cord            Visualized
 Insertion:

 Amniotic Fluid
 AFI FV:      Subjectively within normal limits
                                             Larg Pckt:     4.3  cm
Biometry

 BPD:     45.2  mm     G. Age:  19w 5d                CI:         77.3   70 - 86
 OFD:     58.5  mm                                    FL/HC:      19.4   16.1 -

 HC:     165.6  mm     G. Age:  19w 2d       56  %    HC/AC:      1.10   1.09 -

 AC:     149.9  mm     G. Age:  20w 1d       82  %    FL/BPD:
 FL:      32.1  mm     G. Age:  20w 0d       78  %    FL/AC:      21.4   20 - 24
 HUM:       31  mm     G. Age:  20w 2d       84  %
 CER:     19.2  mm     G. Age:  18w 4d       38  %
 NFT:      4.7  mm

 Est. FW:     328  gm    0 lb 12 oz      60  %
Gestational Age

 LMP:           19w 6d        Date:  10/15/13                 EDD:   07/22/14
 U/S Today:     19w 5d                                        EDD:   07/23/14
 Best:          19w 0d     Det. By:  Early Ultrasound         EDD:   07/28/14
                                     (12/21/13)
Anatomy

 Cranium:          Appears normal         Aortic Arch:      Appears normal
 Fetal Cavum:      Appears normal         Ductal Arch:      Appears normal
 Ventricles:       Appears normal         Diaphragm:        Appears normal
 Choroid Plexus:   Appears normal         Stomach:          Appears normal, left
                                                            sided
 Cerebellum:       Appears normal         Abdomen:          Appears normal
 Posterior Fossa:  Appears normal         Abdominal Wall:   Appears nml (cord
                                                            insert, abd wall)
 Nuchal Fold:      Appears normal         Cord Vessels:     Appears normal (3
                                                            vessel cord)
 Face:             Appears normal         Kidneys:          Appear normal
                   (orbits and profile)
 Lips:             Appears normal         Bladder:          Appears normal
 Palate:           Appears normal         Spine:            Appears normal
 Heart:            Echogenic focus        Lower             Appears normal
                   in LV
                                          Extremities:
 RVOT:             Appears normal         Upper             Appears normal
                                          Extremities:
 LVOT:             Appears normal

 Other:  Male genitalia seen and are without apparent defect. Heels and 5th
         digit appear normal.
Targeted Anatomy

 Fetal Central Nervous System
 Cisterna Magna:
Cervix Uterus Adnexa

 Cervical Length:    3.9      cm

 Cervix:       Normal appearance by transabdominal scan. Appears
               closed, without funnelling.
 Left Ovary:    Not visualized. No adnexal mass visualized.
 Right Ovary:   Within normal limits.
Comments

 I demonstrated the echogenic intracardiac focus (EIF) on the
 ultrasound screen and explained the issues surrounding the
 EIF. This finding has been implicated as a marker for
 increased risk of aneuploidy, especially Trisomy 21. However,
 it is also seen as a normal variant in 2-4% of all pregnancy
 ultrasound exams. Although its significance as an isolated
 finding is not entirely clear, a consensus opinion would be
 that an isolated EIF (with no other risk factors or suspicious
 ultrasound findings) does not significantly increase risk of
 aneuploidy. With her relatively young age and absence of
 other sonographic findings, I told your patient that her risk for
 chromosome abnormality is not increased to the point that
 amniocentesis is recommended. I also emphasized that the
 echogenic focus is not a "heart defect" and no special fetal
 surveillance or neonatal cardiac evaluation is recommended.
Impression

 Active SIUP at 08w7d in gestation complicated by sporadic
 maternal tachyarrhythmia
 EFW 60th%
 Apparently isolated EIF
 No structural defects
 Patient remains low risk for T21 but now desires msQUAD via
 your office
 No previa

 Today's discussion included maternal history of
 tachyarrythmia (verbal report by patient who states that a
 cardiologist will decide whether she needs to be placed on a
 calcium channel blocker and that she has been advised to
 avoid caffeine).  She states that neither she or the father of
 this baby had a congenital heart defect but they had a
 previous child with delayed closure of ductus arteriosus noted
 at 4 months of age (murmur on physical exam in
 pediatrician's office).  She states that she was previously
 scheduled for fetal echocardiogram.  I did not change any
 recommendations or scheduled evaluation in this regard.
Recommendations

 1. interval growth in 6 weeks
 2. antenatal testing after 32 weeks if patient is placed on a
 medication for HTN and/or rate control.
 3. patient desires maternal serum quad screen in your office
 within this week.
